# Patient Record
Sex: Female | Born: 1935
Health system: Southern US, Community
[De-identification: ages and names within clinical notes are randomized; demographics above are authoritative.]

## PROBLEM LIST (undated history)

## (undated) DIAGNOSIS — N390 Urinary tract infection, site not specified: Secondary | ICD-10-CM

## (undated) DIAGNOSIS — IMO0001 Reserved for inherently not codable concepts without codable children: Secondary | ICD-10-CM

## (undated) DIAGNOSIS — F0390 Unspecified dementia without behavioral disturbance: Secondary | ICD-10-CM

## (undated) DIAGNOSIS — M1990 Unspecified osteoarthritis, unspecified site: Secondary | ICD-10-CM

## (undated) DIAGNOSIS — E7800 Pure hypercholesterolemia, unspecified: Secondary | ICD-10-CM

## (undated) DIAGNOSIS — K219 Gastro-esophageal reflux disease without esophagitis: Secondary | ICD-10-CM

## (undated) DIAGNOSIS — I10 Essential (primary) hypertension: Secondary | ICD-10-CM

## (undated) DIAGNOSIS — A419 Sepsis, unspecified organism: Secondary | ICD-10-CM

## (undated) HISTORY — DX: Unspecified dementia, unspecified severity, without behavioral disturbance, psychotic disturbance, mood disturbance, and anxiety: F03.90

## (undated) HISTORY — DX: Essential (primary) hypertension: I10

## (undated) HISTORY — DX: Unspecified osteoarthritis, unspecified site: M19.90

## (undated) HISTORY — DX: Gastro-esophageal reflux disease without esophagitis: K21.9

## (undated) HISTORY — DX: Pure hypercholesterolemia, unspecified: E78.00

## (undated) HISTORY — DX: Reserved for inherently not codable concepts without codable children: IMO0001

## (undated) HISTORY — PX: OTHER SURGICAL HISTORY: SHX169

---

## 2001-06-09 ENCOUNTER — Other Ambulatory Visit: Admission: RE | Admit: 2001-06-09 | Discharge: 2001-06-09 | Payer: Self-pay | Admitting: Dermatology

## 2001-11-16 ENCOUNTER — Encounter: Payer: Self-pay | Admitting: Internal Medicine

## 2001-11-16 ENCOUNTER — Ambulatory Visit (HOSPITAL_COMMUNITY): Admission: RE | Admit: 2001-11-16 | Discharge: 2001-11-16 | Payer: Self-pay | Admitting: Internal Medicine

## 2001-11-18 ENCOUNTER — Ambulatory Visit (HOSPITAL_COMMUNITY): Admission: RE | Admit: 2001-11-18 | Discharge: 2001-11-18 | Payer: Self-pay | Admitting: Family Medicine

## 2001-11-18 ENCOUNTER — Encounter: Payer: Self-pay | Admitting: Family Medicine

## 2002-07-03 ENCOUNTER — Encounter: Payer: Self-pay | Admitting: Orthopaedic Surgery

## 2002-07-03 ENCOUNTER — Ambulatory Visit (HOSPITAL_COMMUNITY): Admission: RE | Admit: 2002-07-03 | Discharge: 2002-07-03 | Payer: Self-pay | Admitting: Orthopaedic Surgery

## 2002-11-29 ENCOUNTER — Ambulatory Visit (HOSPITAL_COMMUNITY): Admission: RE | Admit: 2002-11-29 | Discharge: 2002-11-29 | Payer: Self-pay | Admitting: Family Medicine

## 2002-11-29 ENCOUNTER — Encounter: Payer: Self-pay | Admitting: Family Medicine

## 2003-03-27 ENCOUNTER — Ambulatory Visit (HOSPITAL_COMMUNITY): Admission: RE | Admit: 2003-03-27 | Discharge: 2003-03-27 | Payer: Self-pay | Admitting: Family Medicine

## 2003-03-27 ENCOUNTER — Encounter: Payer: Self-pay | Admitting: Family Medicine

## 2003-04-19 ENCOUNTER — Other Ambulatory Visit: Admission: RE | Admit: 2003-04-19 | Discharge: 2003-04-19 | Payer: Self-pay | Admitting: Ophthalmology

## 2003-12-14 ENCOUNTER — Ambulatory Visit (HOSPITAL_COMMUNITY): Admission: RE | Admit: 2003-12-14 | Discharge: 2003-12-14 | Payer: Self-pay | Admitting: Family Medicine

## 2003-12-18 ENCOUNTER — Ambulatory Visit (HOSPITAL_COMMUNITY): Admission: RE | Admit: 2003-12-18 | Discharge: 2003-12-18 | Payer: Self-pay | Admitting: Family Medicine

## 2004-06-09 ENCOUNTER — Ambulatory Visit (HOSPITAL_COMMUNITY): Admission: RE | Admit: 2004-06-09 | Discharge: 2004-06-09 | Payer: Self-pay | Admitting: Orthopaedic Surgery

## 2004-12-16 ENCOUNTER — Ambulatory Visit: Payer: Self-pay | Admitting: Cardiology

## 2005-01-15 ENCOUNTER — Ambulatory Visit (HOSPITAL_COMMUNITY): Admission: RE | Admit: 2005-01-15 | Discharge: 2005-01-15 | Payer: Self-pay | Admitting: Family Medicine

## 2005-01-16 ENCOUNTER — Ambulatory Visit: Payer: Self-pay | Admitting: Cardiology

## 2005-02-13 ENCOUNTER — Ambulatory Visit (HOSPITAL_COMMUNITY): Admission: RE | Admit: 2005-02-13 | Discharge: 2005-02-13 | Payer: Self-pay | Admitting: Cardiology

## 2005-02-13 ENCOUNTER — Ambulatory Visit: Payer: Self-pay | Admitting: *Deleted

## 2005-03-06 ENCOUNTER — Ambulatory Visit: Payer: Self-pay | Admitting: *Deleted

## 2005-03-19 ENCOUNTER — Ambulatory Visit: Payer: Self-pay | Admitting: *Deleted

## 2005-06-01 ENCOUNTER — Ambulatory Visit (HOSPITAL_COMMUNITY): Admission: RE | Admit: 2005-06-01 | Discharge: 2005-06-01 | Payer: Self-pay | Admitting: Family Medicine

## 2005-06-04 ENCOUNTER — Ambulatory Visit: Payer: Self-pay | Admitting: Internal Medicine

## 2006-02-02 ENCOUNTER — Ambulatory Visit (HOSPITAL_COMMUNITY): Admission: RE | Admit: 2006-02-02 | Discharge: 2006-02-02 | Payer: Self-pay | Admitting: Family Medicine

## 2006-04-14 ENCOUNTER — Ambulatory Visit: Payer: Self-pay | Admitting: Internal Medicine

## 2006-04-14 ENCOUNTER — Ambulatory Visit (HOSPITAL_COMMUNITY): Admission: RE | Admit: 2006-04-14 | Discharge: 2006-04-14 | Payer: Self-pay | Admitting: Internal Medicine

## 2006-07-28 ENCOUNTER — Ambulatory Visit: Payer: Self-pay | Admitting: Internal Medicine

## 2007-02-14 ENCOUNTER — Ambulatory Visit (HOSPITAL_COMMUNITY): Admission: RE | Admit: 2007-02-14 | Discharge: 2007-02-14 | Payer: Self-pay | Admitting: Ophthalmology

## 2007-05-16 ENCOUNTER — Ambulatory Visit (HOSPITAL_COMMUNITY): Admission: RE | Admit: 2007-05-16 | Discharge: 2007-05-16 | Payer: Self-pay | Admitting: Ophthalmology

## 2007-10-25 ENCOUNTER — Ambulatory Visit (HOSPITAL_COMMUNITY): Admission: RE | Admit: 2007-10-25 | Discharge: 2007-10-25 | Payer: Self-pay | Admitting: Family Medicine

## 2008-01-31 ENCOUNTER — Ambulatory Visit (HOSPITAL_COMMUNITY): Admission: RE | Admit: 2008-01-31 | Discharge: 2008-01-31 | Payer: Self-pay | Admitting: Family Medicine

## 2008-02-23 ENCOUNTER — Ambulatory Visit: Payer: Self-pay | Admitting: Internal Medicine

## 2008-02-23 ENCOUNTER — Encounter: Payer: Self-pay | Admitting: Cardiovascular Disease

## 2008-03-06 ENCOUNTER — Ambulatory Visit (HOSPITAL_COMMUNITY): Admission: RE | Admit: 2008-03-06 | Discharge: 2008-03-06 | Payer: Self-pay | Admitting: Family Medicine

## 2008-11-06 ENCOUNTER — Ambulatory Visit (HOSPITAL_COMMUNITY): Admission: RE | Admit: 2008-11-06 | Discharge: 2008-11-06 | Payer: Self-pay | Admitting: Family Medicine

## 2009-01-02 ENCOUNTER — Ambulatory Visit (HOSPITAL_COMMUNITY): Admission: RE | Admit: 2009-01-02 | Discharge: 2009-01-02 | Payer: Self-pay | Admitting: Family Medicine

## 2009-06-03 ENCOUNTER — Encounter: Payer: Self-pay | Admitting: Gastroenterology

## 2009-07-29 ENCOUNTER — Ambulatory Visit (HOSPITAL_COMMUNITY): Admission: RE | Admit: 2009-07-29 | Discharge: 2009-07-29 | Payer: Self-pay | Admitting: Ophthalmology

## 2009-09-10 ENCOUNTER — Ambulatory Visit (HOSPITAL_COMMUNITY): Admission: RE | Admit: 2009-09-10 | Discharge: 2009-09-10 | Payer: Self-pay | Admitting: Family Medicine

## 2009-11-19 ENCOUNTER — Observation Stay (HOSPITAL_COMMUNITY): Admission: EM | Admit: 2009-11-19 | Discharge: 2009-11-21 | Payer: Self-pay | Admitting: Emergency Medicine

## 2010-01-29 ENCOUNTER — Ambulatory Visit (HOSPITAL_COMMUNITY): Admission: RE | Admit: 2010-01-29 | Discharge: 2010-01-29 | Payer: Self-pay | Admitting: Internal Medicine

## 2010-02-11 ENCOUNTER — Ambulatory Visit (HOSPITAL_COMMUNITY): Admission: RE | Admit: 2010-02-11 | Discharge: 2010-02-11 | Payer: Self-pay | Admitting: Internal Medicine

## 2010-03-10 ENCOUNTER — Emergency Department (HOSPITAL_COMMUNITY): Admission: EM | Admit: 2010-03-10 | Discharge: 2010-03-11 | Payer: Self-pay | Admitting: Emergency Medicine

## 2010-03-10 ENCOUNTER — Encounter: Payer: Self-pay | Admitting: Orthopedic Surgery

## 2010-03-11 ENCOUNTER — Ambulatory Visit: Payer: Self-pay | Admitting: Orthopedic Surgery

## 2010-03-11 DIAGNOSIS — S52599A Other fractures of lower end of unspecified radius, initial encounter for closed fracture: Secondary | ICD-10-CM | POA: Insufficient documentation

## 2010-03-12 ENCOUNTER — Telehealth: Payer: Self-pay | Admitting: Orthopedic Surgery

## 2010-03-12 ENCOUNTER — Encounter: Payer: Self-pay | Admitting: Orthopedic Surgery

## 2010-03-13 ENCOUNTER — Encounter (INDEPENDENT_AMBULATORY_CARE_PROVIDER_SITE_OTHER): Payer: Self-pay | Admitting: *Deleted

## 2010-03-13 ENCOUNTER — Ambulatory Visit: Payer: Self-pay | Admitting: Orthopedic Surgery

## 2010-03-13 DIAGNOSIS — S52609A Unspecified fracture of lower end of unspecified ulna, initial encounter for closed fracture: Secondary | ICD-10-CM

## 2010-03-13 DIAGNOSIS — S52509A Unspecified fracture of the lower end of unspecified radius, initial encounter for closed fracture: Secondary | ICD-10-CM | POA: Insufficient documentation

## 2010-03-14 ENCOUNTER — Ambulatory Visit (HOSPITAL_COMMUNITY): Admission: RE | Admit: 2010-03-14 | Discharge: 2010-03-14 | Payer: Self-pay | Admitting: Orthopedic Surgery

## 2010-03-14 ENCOUNTER — Ambulatory Visit: Payer: Self-pay | Admitting: Orthopedic Surgery

## 2010-03-17 ENCOUNTER — Encounter: Payer: Self-pay | Admitting: Orthopedic Surgery

## 2010-03-18 ENCOUNTER — Ambulatory Visit: Payer: Self-pay | Admitting: Orthopedic Surgery

## 2010-03-20 ENCOUNTER — Encounter (HOSPITAL_COMMUNITY): Admission: RE | Admit: 2010-03-20 | Discharge: 2010-04-25 | Payer: Self-pay | Admitting: Orthopedic Surgery

## 2010-03-20 ENCOUNTER — Encounter: Payer: Self-pay | Admitting: Orthopedic Surgery

## 2010-04-08 ENCOUNTER — Ambulatory Visit: Payer: Self-pay | Admitting: Orthopedic Surgery

## 2010-04-10 ENCOUNTER — Telehealth: Payer: Self-pay | Admitting: Orthopedic Surgery

## 2010-04-11 ENCOUNTER — Encounter: Payer: Self-pay | Admitting: Orthopedic Surgery

## 2010-04-16 ENCOUNTER — Encounter: Payer: Self-pay | Admitting: Orthopedic Surgery

## 2010-04-17 ENCOUNTER — Ambulatory Visit: Payer: Self-pay | Admitting: Orthopedic Surgery

## 2010-04-21 ENCOUNTER — Telehealth: Payer: Self-pay | Admitting: Orthopedic Surgery

## 2010-04-24 ENCOUNTER — Ambulatory Visit: Payer: Self-pay | Admitting: Orthopedic Surgery

## 2010-04-28 ENCOUNTER — Encounter (HOSPITAL_COMMUNITY): Admission: RE | Admit: 2010-04-28 | Discharge: 2010-05-28 | Payer: Self-pay | Admitting: Orthopedic Surgery

## 2010-05-13 ENCOUNTER — Ambulatory Visit: Payer: Self-pay | Admitting: Orthopedic Surgery

## 2010-05-15 ENCOUNTER — Encounter: Payer: Self-pay | Admitting: Orthopedic Surgery

## 2010-05-30 ENCOUNTER — Encounter (HOSPITAL_COMMUNITY): Admission: RE | Admit: 2010-05-30 | Discharge: 2010-06-29 | Payer: Self-pay | Admitting: Orthopedic Surgery

## 2010-06-11 ENCOUNTER — Ambulatory Visit: Payer: Self-pay | Admitting: Orthopedic Surgery

## 2010-07-01 ENCOUNTER — Encounter (HOSPITAL_COMMUNITY): Admission: RE | Admit: 2010-07-01 | Discharge: 2010-07-31 | Payer: Self-pay | Admitting: Orthopedic Surgery

## 2010-07-01 ENCOUNTER — Encounter: Payer: Self-pay | Admitting: Orthopedic Surgery

## 2010-07-08 ENCOUNTER — Encounter: Payer: Self-pay | Admitting: Orthopedic Surgery

## 2010-07-14 ENCOUNTER — Ambulatory Visit: Payer: Self-pay | Admitting: Orthopedic Surgery

## 2010-07-14 DIAGNOSIS — M25519 Pain in unspecified shoulder: Secondary | ICD-10-CM | POA: Insufficient documentation

## 2010-07-15 ENCOUNTER — Encounter: Payer: Self-pay | Admitting: Orthopedic Surgery

## 2010-08-14 ENCOUNTER — Ambulatory Visit: Payer: Self-pay | Admitting: Orthopedic Surgery

## 2010-11-13 ENCOUNTER — Ambulatory Visit: Payer: Self-pay | Admitting: Orthopedic Surgery

## 2010-11-13 DIAGNOSIS — M7710 Lateral epicondylitis, unspecified elbow: Secondary | ICD-10-CM | POA: Insufficient documentation

## 2010-12-17 ENCOUNTER — Ambulatory Visit: Payer: Self-pay | Admitting: Orthopedic Surgery

## 2010-12-17 DIAGNOSIS — M67919 Unspecified disorder of synovium and tendon, unspecified shoulder: Secondary | ICD-10-CM | POA: Insufficient documentation

## 2010-12-17 DIAGNOSIS — M719 Bursopathy, unspecified: Secondary | ICD-10-CM

## 2011-01-04 IMAGING — CR DG ABDOMEN 2V
2 series · 2 of 2 positions shown · non-contrast
Comparison: None

CLINICAL DATA: Dizziness, weakness.  Intermittent abdominal pain.

ABDOMEN - 2 VIEW

[view not recorded (1 of 2)]
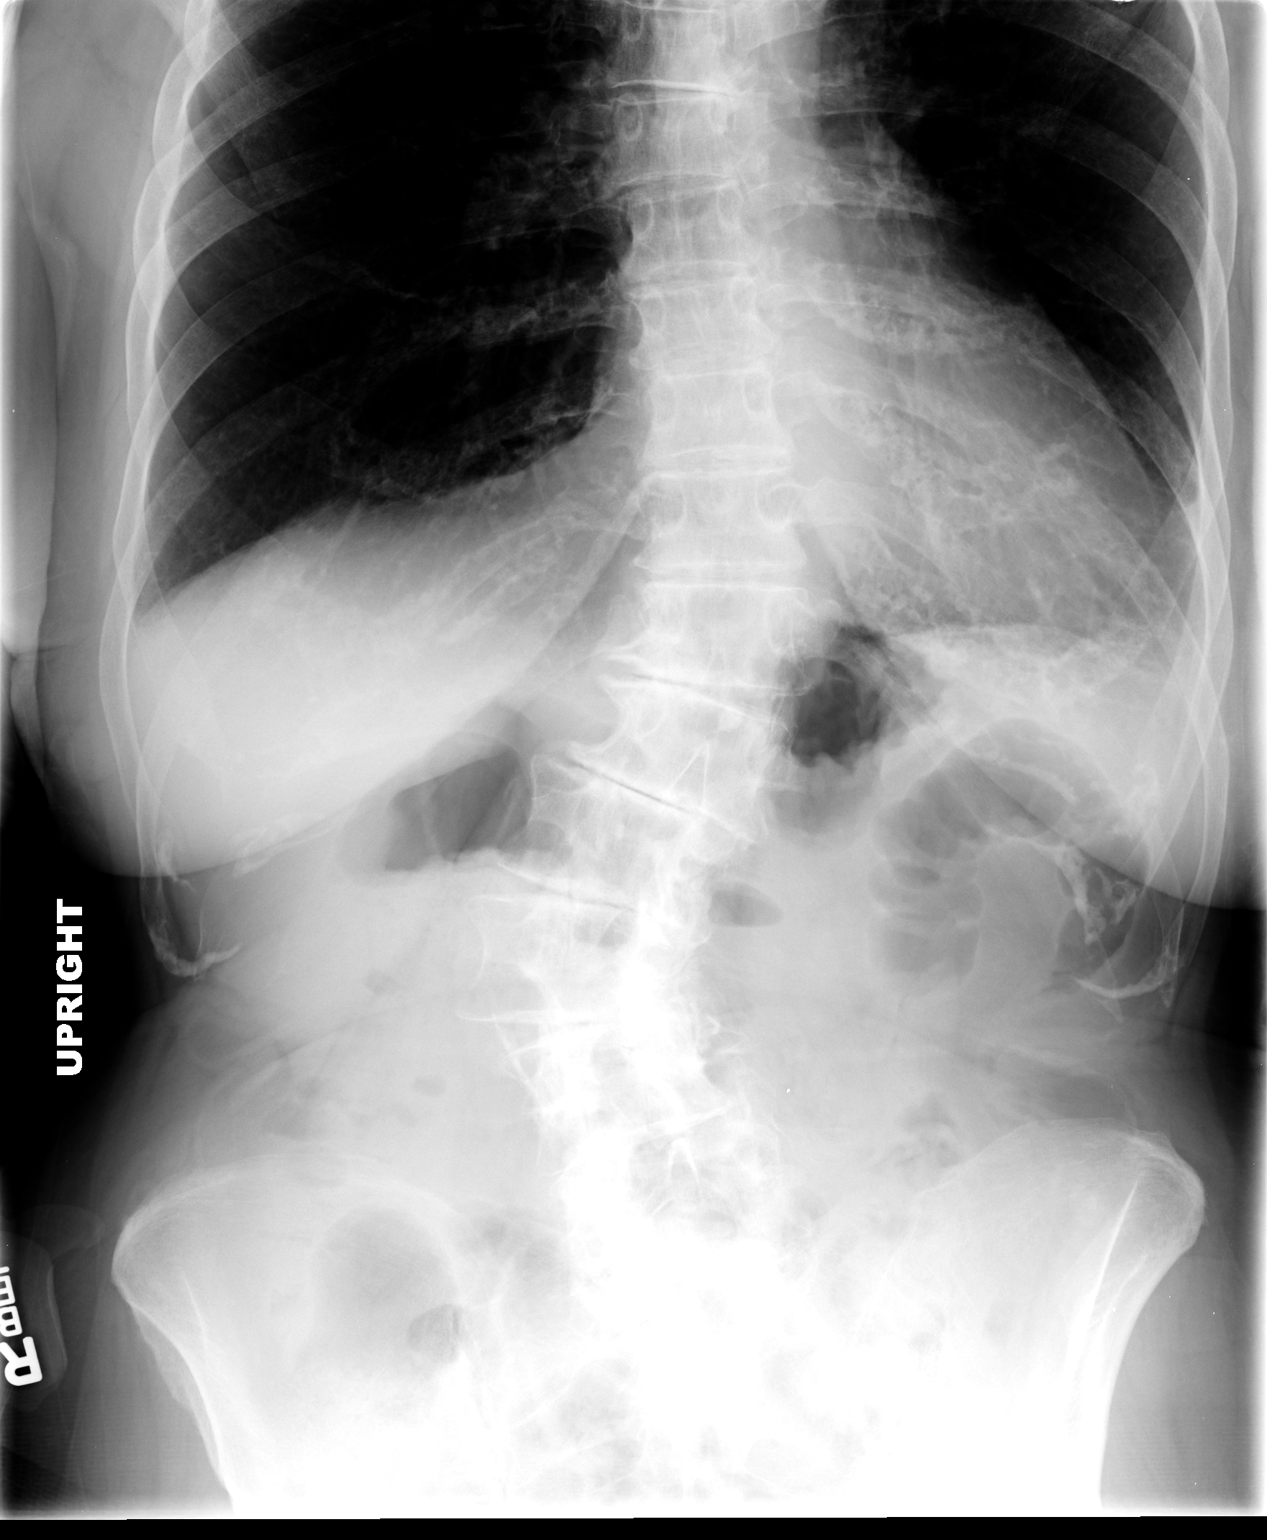

[view not recorded (2 of 2)]
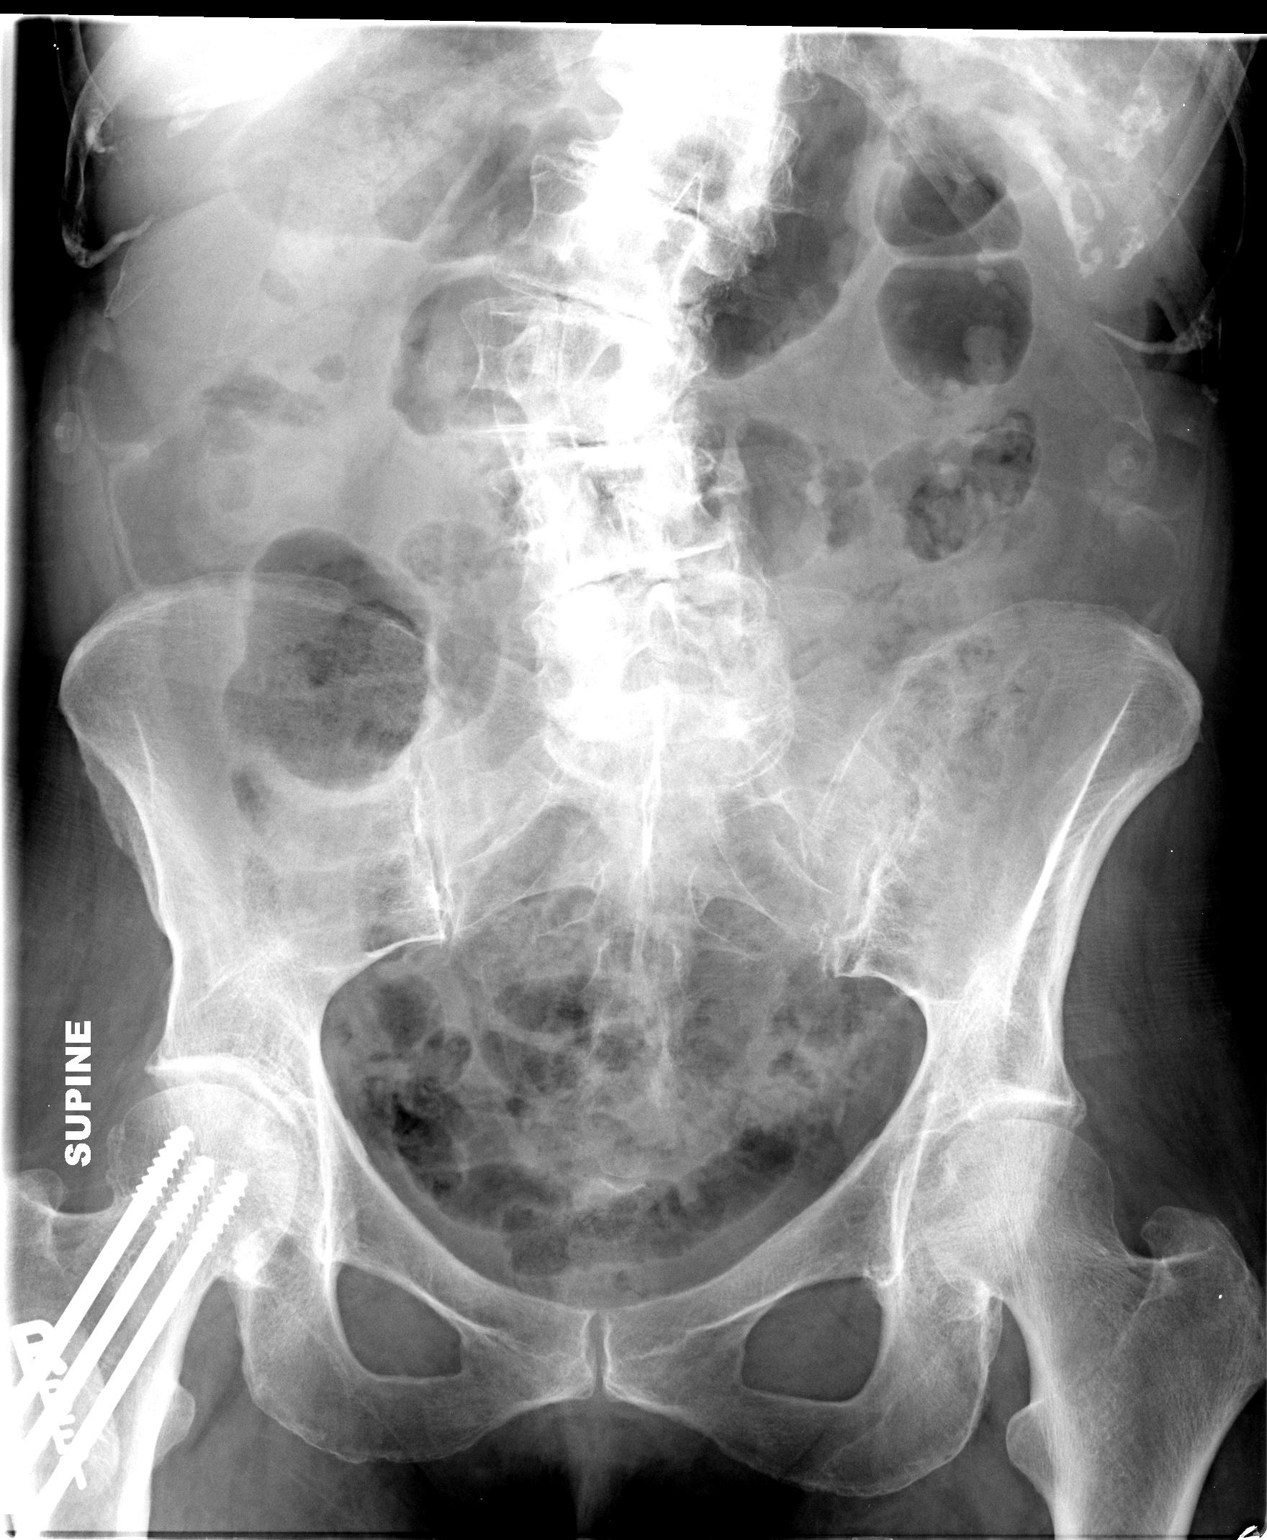

[2 of 2 positions shown; findings below may reference images not displayed]

FINDINGS: There is a nonobstructive bowel gas pattern.  No
organomegaly or suspicious calcification.  No free air.  Severe
scoliosis in the lumbar spine with associated severe degenerative
changes.  Right femoral neck screws in place.

No suspicious calcification.
IMPRESSION: No acute findings in the abdomen.

## 2011-01-04 IMAGING — CT CT HEAD W/O CM
1 series · 16 of 30 positions shown, 20 images · non-contrast
Comparison: 01/02/2009

CLINICAL DATA: Dizziness, weakness.  Headaches.

CT HEAD WITHOUT CONTRAST
TECHNIQUE: Contiguous axial images were obtained from the base of
the skull through the vertex without contrast.

[Series 2: headseq 4.8 h37s · axial · 0.43mm/px · z∈[+111,+268]mm · 16 of 36 slices shown, 20 images]
[im 2/36  brain]
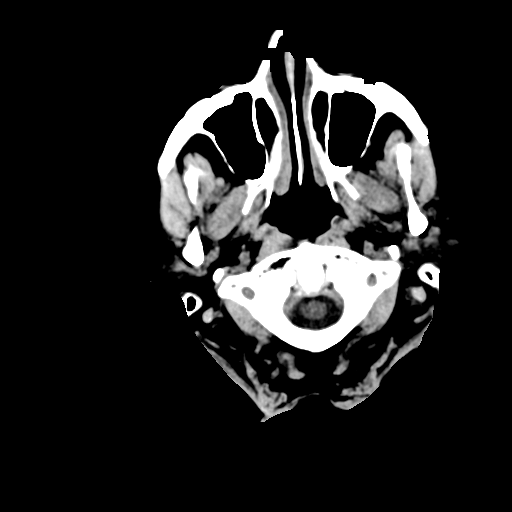
[im 2/36  bone]
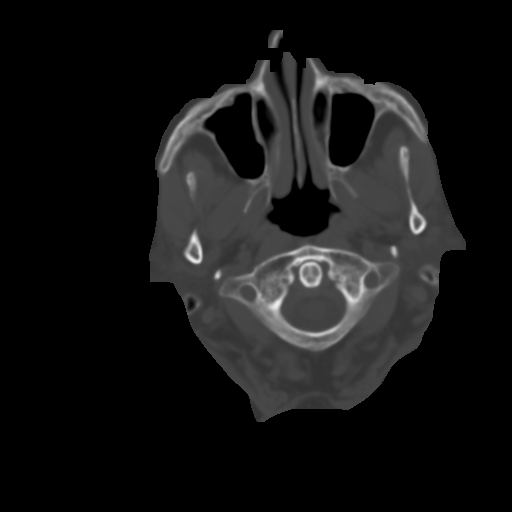
[im 4/36  brain]
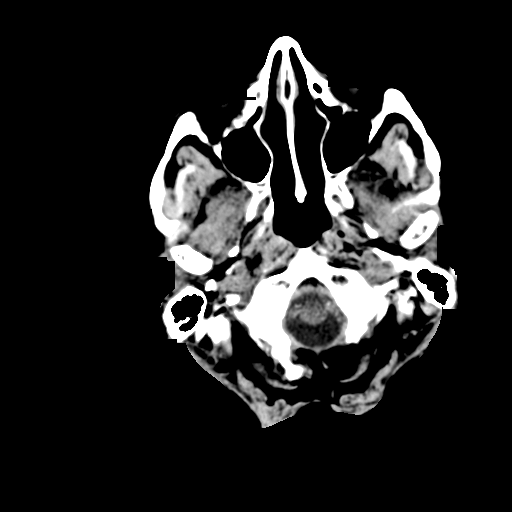
[im 7/36  brain]
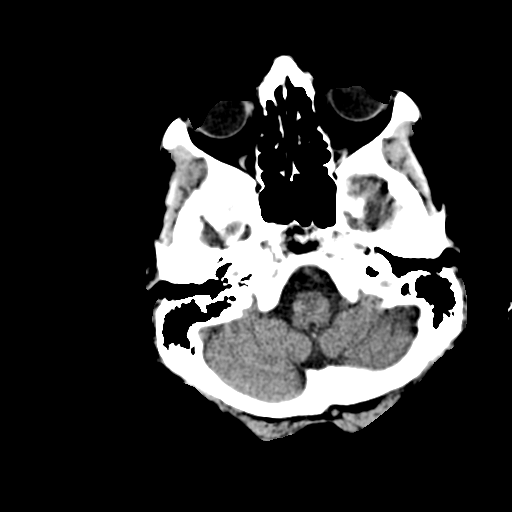
[im 9/36  brain]
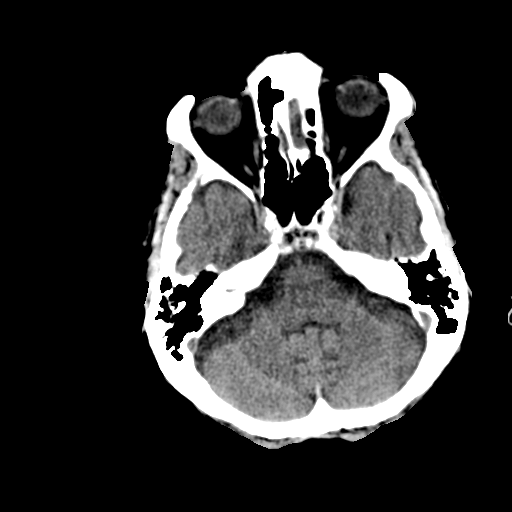
[im 10/36  brain]
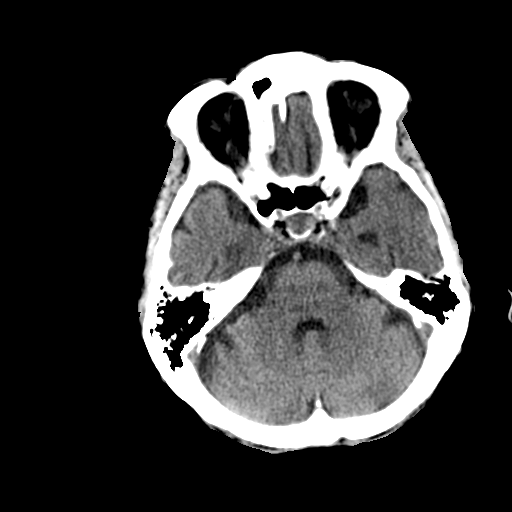
[im 10/36  bone]
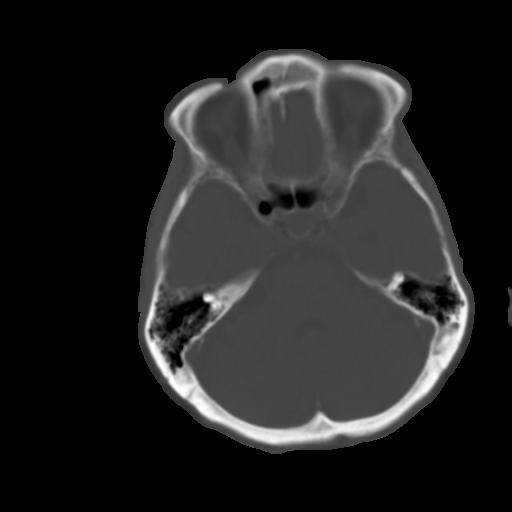
[im 13/36  brain]
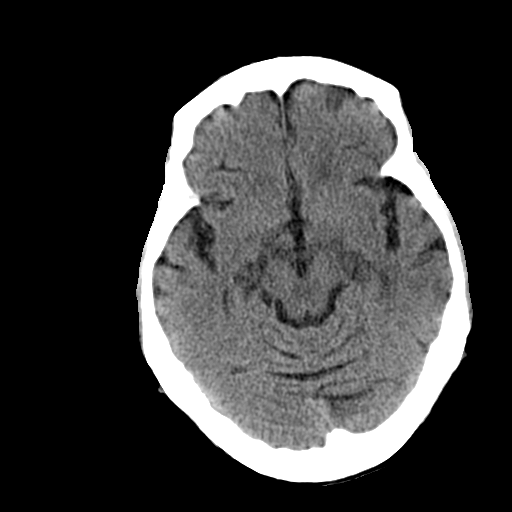
[im 15/36  brain]
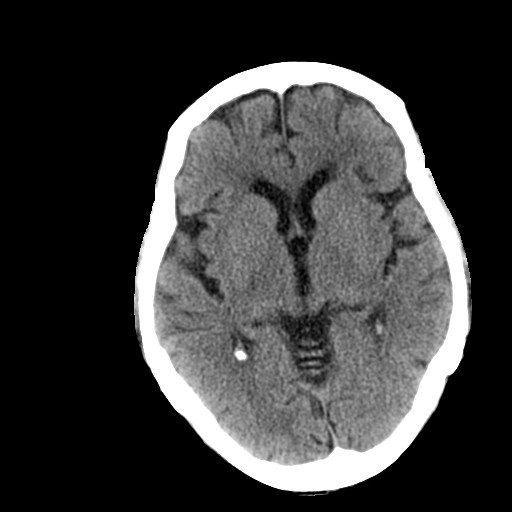
[im 17/36  brain]
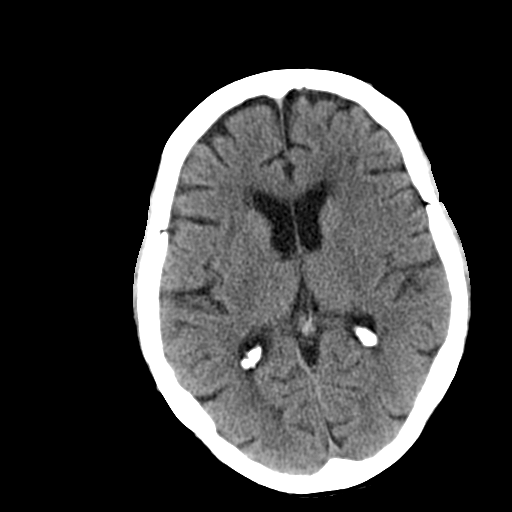
[im 19/36  brain]
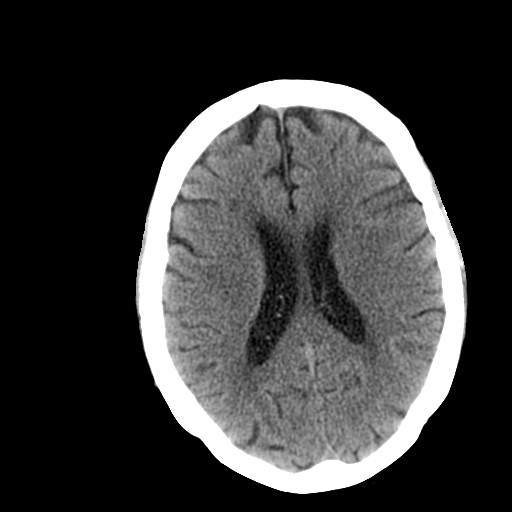
[im 19/36  bone]
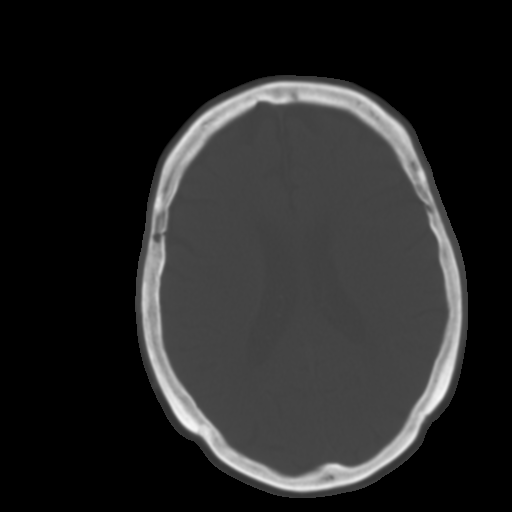
[im 21/36  brain]
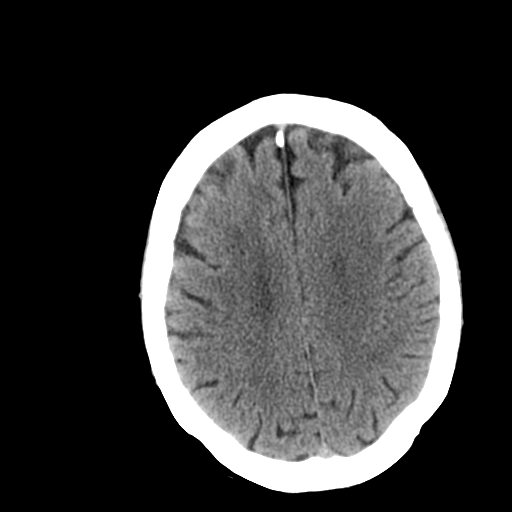
[im 23/36  brain]
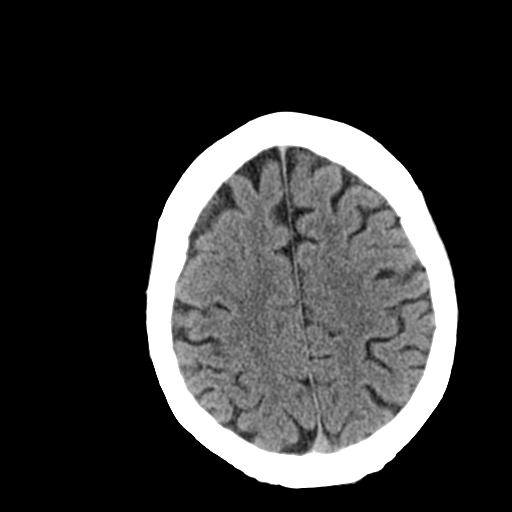
[im 26/36  brain]
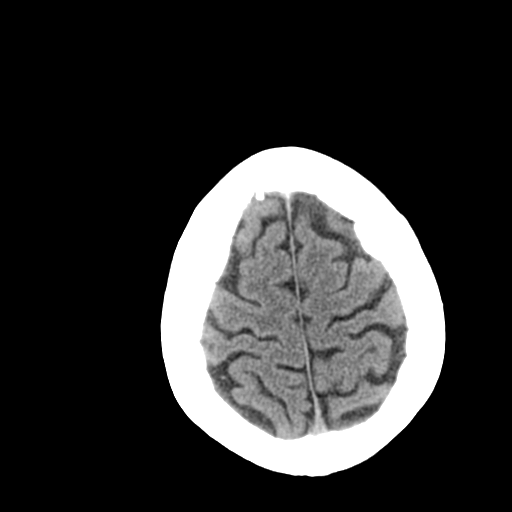
[im 27/36  brain]
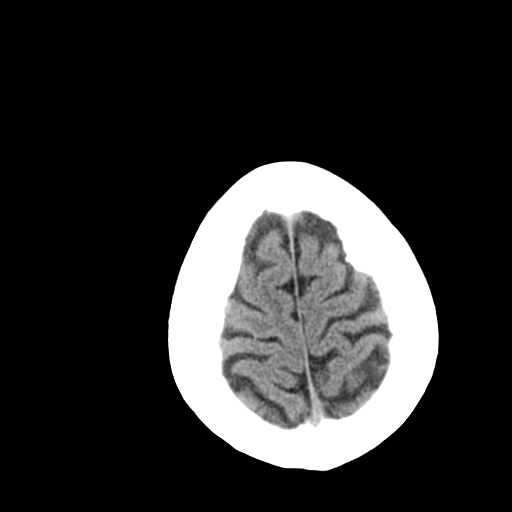
[im 27/36  bone]
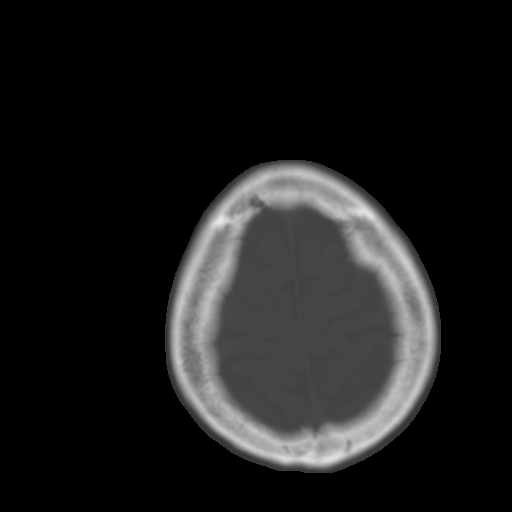
[im 29/36  brain]
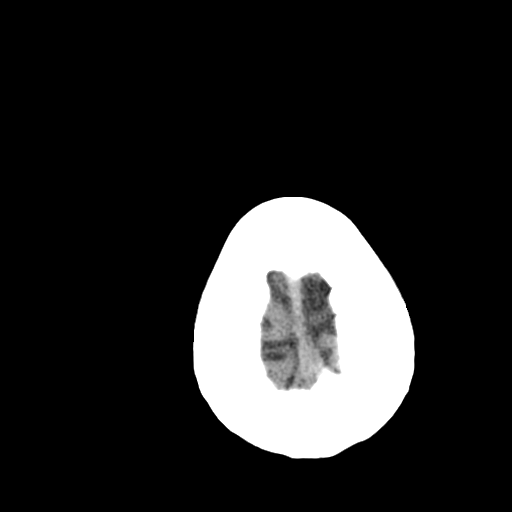
[im 32/36  brain]
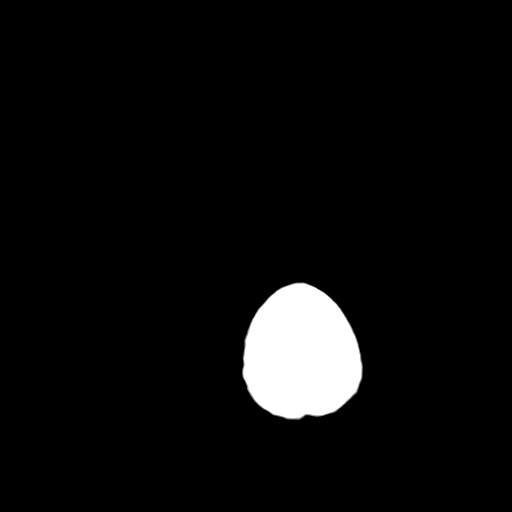
[im 34/36  brain]
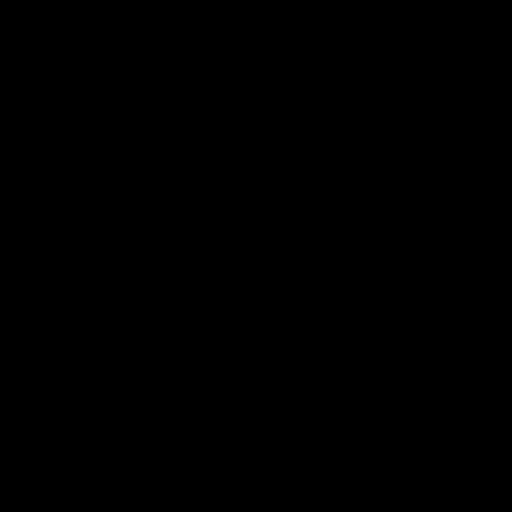

[16 of 30 positions shown; findings below may reference images not displayed]

FINDINGS: Patchy chronic small vessel disease changes in the deep
white matter. No acute intracranial abnormality.  Specifically, no
hemorrhage, hydrocephalus, mass lesion, acute infarction, or
significant intracranial injury.  No acute calvarial abnormality.

Visualized paranasal sinuses and mastoids clear.  Orbital soft
tissues unremarkable.
IMPRESSION: No acute intracranial abnormality.

Patchy chronic small vessel disease.

## 2011-01-18 ENCOUNTER — Encounter (INDEPENDENT_AMBULATORY_CARE_PROVIDER_SITE_OTHER): Payer: Self-pay | Admitting: Internal Medicine

## 2011-01-27 NOTE — Assessment & Plan Note (Signed)
Summary: 1 WK RE-CK LT WRIST/HUMANA/CAF   Visit Type:  Follow-up Referring Provider:  ap er Primary Provider:  Dr. Dwana Melena  CC:  left wrist post op.  History of Present Illness:    This is postop day 34 for this 77 year old female who had open treatment internal fixation LEFT wrist with DVR plate.  Somehow she did not come in for her March 31 appointment for staple removal and x-ray  She is taking Tylenol for pain complains of some volar wrist pain  imaging:  The x-rays were obtained including 3 views of the wrist fracture is healing in good position and the plate is in good position  one-week recheck wound after delayed staple removal  The patient did see Dr. Hilda Lias, who started her on antibiotics because her wound drainage and purulence. The wound seems to be much better today. He also started some wound care and physical therapy for her hand, which was started.  The wound looks better. Today. We will continue her antibiotics and hand therapy, as well as one cannot check the wound again in a week  Allergies: No Known Drug Allergies   Impression & Recommendations:  Problem # 1:  CLOSED FRACTURE OF LOWER END OF RADIUS WITH ULNA (ICD-813.44)  Orders: Post-Op Check (09811)  Patient Instructions: 1)  1 week check wound

## 2011-01-27 NOTE — Letter (Signed)
Summary: Dr Hilda Lias Office notes  Dr Hilda Lias Office notes   Imported By: Cammie Sickle 04/23/2010 12:38:35  _____________________________________________________________________  External Attachment:    Type:   Image     Comment:   External Document

## 2011-01-27 NOTE — Miscellaneous (Signed)
Summary: Fuctional Progress chart  Fuctional Progress chart   Imported By: Jacklynn Ganong 07/04/2010 10:24:17  _____________________________________________________________________  External Attachment:    Type:   Image     Comment:   External Document

## 2011-01-27 NOTE — Miscellaneous (Signed)
Summary: OT Discharge summary  OT Discharge summary   Imported By: Jacklynn Ganong 07/17/2010 09:29:28  _____________________________________________________________________  External Attachment:    Type:   Image     Comment:   External Document

## 2011-01-27 NOTE — Progress Notes (Signed)
Summary: does she need to come in tomorrow?  Phone Note Call from Patient   Summary of Call: Dawn Lee (10/08/1923) saw Dr. Dwana Melena  today and he cut back on her BP meds and wants to see her again on Monday (03/17/10) She has an appointment scheduled to see you tomorrow and wants to know if you need to see her before she sees Dr. Margo Aye for the rechek on her BP. We need to call Para March at [phone] Initial call taken by: Jacklynn Ganong,  March 12, 2010 12:16 PM  Follow-up for Phone Call        yes that's good angular N. because I want to go ahead and put the cast on in case she can't have the surgery Follow-up by: Fuller Canada MD,  March 12, 2010 12:24 PM  Additional Follow-up for Phone Call Additional follow up Details #1::        Left a message for Para March to call the office Additional Follow-up by: Jacklynn Ganong,  March 12, 2010 12:32 PM

## 2011-01-27 NOTE — Assessment & Plan Note (Signed)
Summary: eval for fx wrist/bsf   Vital Signs:  Patient profile:   77 year old female Pulse rate:   66 / minute Resp:     16 per minute  Vitals Entered By: Fuller Canada MD (March 11, 2010 4:07 PM)  Visit Type:  initial visit Referring Provider:  ap er Primary Provider:  Dr. Dwana Melena  CC:  left wrist fracture.  History of Present Illness: 77 year old female walk-in patient after seen in the ER for fractured LEFT wrist  Patient fell at home injuring her LEFT wrist, complains of throbbing burning constant pain which is a 9/10.  She apparently got dizzy and has been getting dizzy over the last month or so.  Her primary care physician is Dr. Dannette Barbara.  DOI 03/10/10 fell.  [facility] xrays left wrist 03/10/10.  Meds: Benicar, Pantoprazole, Lovastatin, Tylenol with Codeine, Tylenol, Advil, Neurontin.    Allergies (verified): No Known Drug Allergies  Past History:  Past Medical History: htn reflux cholesterol  Past Surgical History: pins rt hip  Family History: Family History of Diabetes Family History Coronary Heart Disease female < 80 Family History of Arthritis  Social History: Patient is widowed.  retired no smoking  no alcohol no caffeine  Review of Systems Constitutional:  Complains of weight loss; denies weight gain, fever, chills, and fatigue. Cardiovascular:  Complains of palpitations, fainting, and murmurs; denies chest pain. Respiratory:  Complains of snoring; denies short of breath, wheezing, couch, tightness, pain on inspiration, and snoring . Gastrointestinal:  Complains of heartburn and blood in your stools; denies nausea, vomiting, diarrhea, and constipation. Genitourinary:  Denies frequency, urgency, difficulty urinating, painful urination, flank pain, and bleeding in urine. Neurologic:  Complains of unsteady gait and dizziness; denies numbness, tingling, tremors, and seizure. Musculoskeletal:  Complains of joint pain, instability, stiffness, and muscle  pain; denies swelling, redness, and heat. Endocrine:  Denies excessive thirst, exessive urination, and heat or cold intolerance. Psychiatric:  Denies nervousness, depression, anxiety, and hallucinations. Skin:  Denies changes in the skin, poor healing, rash, itching, and redness. HEENT:  Denies blurred or double vision, eye pain, redness, and watering; headache. Immunology:  Denies seasonal allergies, sinus problems, and allergic to bee stings. Hemoatologic:  Complains of brusing; denies easy bleeding.  Physical Exam  Additional Exam:  Vital signs are stable.  Appearance is normal.  Cardiovascular exam is normal.  Lymph nodes negative in the upper extremity  Skin normal  Neurologic exam normal  Psychiatric exam normal  Musculoskeletal exam showsith assistance  Her LEFT upper extremity is in a splint I can see all of her digits are mildly swollen she has full range of motion in the hand, no motor deficit, noticed instability.     Impression & Recommendations:  Problem # 1:  OTHER CLOSED FRACTURES OF DISTAL END OF RADIUS (BJY-782.95) Assessment New  the x-rays from the hospital show a comminuted intra-articular fracture of the LEFT distal radius with shortening loss of radial inclination and dorsal tilt.  Recommend open treatment internal fixation pending medical clearance  Patient referred back to her medical doctor for medical clearance  Orders: New Patient Level III (62130)  Patient Instructions: 1)  followup in 2 days please see medical physician

## 2011-01-27 NOTE — Assessment & Plan Note (Signed)
Summary: 3 mo reck left wrist/humana/bsf   Visit Type:  Follow-up Referring Provider:  ap er Primary Provider:  Dr. Dwana Melena  CC:  left wrist.  History of Present Illness: I saw Dawn Lee in the office today for a 3 month followup visit.  She is a 77 years old woman with the complaint of:  left wrist.  Distal radius and ulnar fracture treated with DVR volar plate with open treatment internal fixation on March 18.  Medications: Tylenol   The patient complains of LEFT shoulder and elbow pain, but no pain with forward elevation. She says she can't drive.  Her wrist pain has resolved and her wrist motion has returned to normal.   Allergies: No Known Drug Allergies  Review of Systems Neurologic:  Denies numbness and tingling. Musculoskeletal:  Complains of joint pain.  Physical Exam  Skin:  intact without lesions or rashes Psych:  alert and cooperative; normal mood and affect; normal attention span and concentration   Shoulder/Elbow Exam  General:    Well-developed, well-nourished, normal body habitus; no deformities, normal grooming.    Skin:    Intact, no scars, lesions, rashes, cafe au lait spots or bruising.    Palpation:    Non-tender to palpation LEFT shoulder  Vascular:    Radial, ulnar, brachial, and axillary pulses 2+ and symmetric; capillary refill less than 2 seconds; no evidence of ischemia, clubbing, or cyanosis.    Sensory:    Gross sensation intact in the upper extremities.    Motor:    Normal strength in the LEFT rotator cuff  Elbow Exam:    Left:    Inspection:  Abnormal    Palpation:  Abnormal    Stability:  stable    Tenderness:  left lateral epicondyle    Swelling:  left lateral epicondyle    normal flexion and extension of the elbow and normal pronation of the forearm. Normal supination of the forearm   Impression & Recommendations:  Problem # 1:  LATERAL EPICONDYLITIS (ICD-726.32) Assessment New  inject LEFT elbow lateral  epicondyle.  The  elbow was prepped with alcohol and anesthetized with ethyl chloride. 40 mg of Depo-Medrol and 5 cc of 1% lidocaine were injected along the lateral epicondyle. No complications. Verbal consent was obtained prior to injection.  Orders: Est. Patient Level III (57846) Joint Aspirate / Injection, Intermediate (20605) Depo- Medrol 40mg  (J1030)  Patient Instructions: 1)  You have received an injection of cortisone today. You may experience increased pain at the injection site. Apply ice pack to the area for 20 minutes every 2 hours and take 2 xtra strength tylenol every 8 hours. This increased pain will usually resolve in 24 hours. The injection will take effect in 3-10 days.   2)  if not better in 2 weeks call for 2nd injection    Orders Added: 1)  Est. Patient Level III [96295] 2)  Joint Aspirate / Injection, Intermediate [20605] 3)  Depo- Medrol 40mg  [J1030]

## 2011-01-27 NOTE — Letter (Signed)
Summary: Generic Letter [facility]  [address] Edmund Hilda [address]  [place], [place] [address]   Phone: [phone]  Fax: [phone]    05/13/2010  JERRIS KELTZ [address] [place], [place]  [address]  Dear Dear Dereck Leep  I am requesting a fast appeal for the above patient for further therapy on the wist as she has not regained functional range of motion and the health of her wrist is at risk.           Sincerely,   Fuller Canada MD

## 2011-01-27 NOTE — Miscellaneous (Signed)
Summary: Functional progress chart  Functional progress chart   Imported By: Jacklynn Ganong 05/21/2010 16:37:29  _____________________________________________________________________  External Attachment:    Type:   Image     Comment:   External Document

## 2011-01-27 NOTE — Miscellaneous (Signed)
Summary: Pre-authorization not required out-patient procedure  Clinical Lists Changes   12:35pm, contacted insurer [facility] [employer] P[employer] [phone], re: out-patient surgery scheduled tomorrow, 03/14/10 [facility] CPT [phone], dx [phone]. Per Ayla, no pre-authorization is required. Ref# [id number].

## 2011-01-27 NOTE — Assessment & Plan Note (Signed)
Summary: RE-CK WRIST/HUMANA/CAF   Visit Type:  Follow-up  CC:  recheck wrist.  History of Present Illness:  77 year old female walk-in patient after seen in the ER for fractured LEFT wrist  Patient fell at home injuring her LEFT wrist, complains of throbbing burning constant pain which is a 9/10.  She apparently got dizzy and has been getting dizzy over the last month or so.  Her primary care physician is Dr. Dannette Barbara.  DOI 03/10/10 fell.  [facility] xrays left wrist 03/10/10.  Meds: Benicar, Pantoprazole, Lovastatin, Tylenol with Codeine, Tylenol, Advil, Neurontin.Meds: Benicar, Pantoprazole, Lovastatin, Tylenol with Codeine, Tylenol, Advil, Neurontin.  She was seen 03/11/10 needs surgery, was seen by Dr. Margo Aye 03/12/10 he is clear for surgery, he decreased her blood pressure medication and stopped her Neurontin she feels much better today no dizziness. Today BP 118/72.  Current Medications (verified): 1)  Metoprolol Succinate 25 Mg Xr24h-Tab (Metoprolol Succinate) .... Take One Tablet By Mouth Daily 2)  Lansoprazole 30 Mg Cpdr (Lansoprazole) 3)  Lovastatin 40 Mg Tabs (Lovastatin) .... Take  Two  Tablet By Mouth Daily At Bedtime  Allergies (verified): No Known Drug Allergies  Past History:  Allergies (verified):  No Known Drug Allergies Past History: Past Medical History: htn reflux cholesterol Past Surgical History: pins rt hip Family History: Family History of Diabetes Family History Coronary Heart Disease female < 55 Family History of Arthritis Social History: Patient is widowed.  retired no smoking  no alcohol no caffeine Review of Systems  Constitutional:  Complains of weight loss; denies weight gain, fever, chills, and fatigue. Cardiovascular:  Complains of palpitations, fainting, and murmurs; denies chest pain. Respiratory:  Complains of snoring; denies short of breath, wheezing, couch, tightness, pain on inspiration, and snoring . Gastrointestinal:  Complains of  heartburn and blood in your stools; denies nausea, vomiting, diarrhea, and constipation. Genitourinary:  Denies frequency, urgency, difficulty urinating, painful urination, flank pain, and bleeding in urine. Neurologic:  Complains of unsteady gait and dizziness; denies numbness, tingling, tremors, and seizure. Musculoskeletal:  Complains of joint pain, instability, stiffness, and muscle pain; denies swelling, redness, and heat. Endocrine:  Denies excessive thirst, exessive urination, and heat or cold intolerance. Psychiatric:  Denies nervousness, depression, anxiety, and hallucinations. Skin:  Denies changes in the skin, poor healing, rash, itching, and redness. HEENT:  Denies blurred or double vision, eye pain, redness, and watering; headache. Immunology:  Denies seasonal allergies, sinus problems, and allergic to bee stings. Hemoatologic:  Complains of brusing; denies easy bleeding.  Physical Exam  Additional Exam:  GEN: well developed, well nourished, normal grooming and hygiene, no deformity and normal body habitus.   CDV: pulses are normal, no edema, no erythema. no tenderness  Lymph: normal lymph nodes   Skin: no rashes, skin lesions or open sores   NEURO: normal coordination, reflexes, sensation.   Psyche: awake, alert and oriented. Mood normal   Gait: slightly abnormal but she ambulates on her own without assistive device  LEFT wrist examination and LEFT upper extremity examination  Shoulder elbow nontender humerus nontender forearm nontender, tenderness distal radius with swelling mild deformity.  Painful range of motion in the wrist limited to 30.  Motor exam normal flexion-extension of the fingers normal  No instability  Lower extremities  The upper extremities have normal appearance, ROM, strength and stability.  RIGHT upper extremity  No contracture subluxation atrophy tremor or tenderness  Inspection ROM Motor Stability    Impression &  Recommendations:  Problem # 1:  CLOSED FRACTURE OF  LOWER END OF RADIUS WITH ULNA (ICD-813.44) Assessment Comment Only comminuted intra-articular fracture distal radius with ulnar  Deformity noted  Splint applied today volarly.  Recommend open treatment internal fixation with DVR volar plate  Risk vs. benefit ratio surgical vs. nonsurgical treatment explained.  Patient agrees that surgery should be done with plating.  Patient Instructions: 1)  Surgery tomorrow 03/14/10 2)  Preop today at [facility] short stay center at 1:45pm, take this packet with you 3)  Come back Tuesday 03/18/10 for post op 1 in our office.  Appended Document: RE-CK WRIST/HUMANA/CAF

## 2011-01-27 NOTE — Assessment & Plan Note (Signed)
Summary: POST OP #1 LEFT WRIST SURG 03/14/10/HUMANA/BSF   Visit Type:  Follow-up Referring Provider:  ap er Primary Provider:  Dr. Dwana Melena  CC:  postop wrist.  History of Present Illness: 77 years old distal radius and ulnar fracture treated with DVR volar plate with open treatment internal fixation on March 18. Procedure OTIF with DVR volar plate left wrist.  Medication Hydrocodone, drives her crazy. Tylenol now helps some.  seems to be doing fine except for the medication issue  Wound looks good  Volar splint applied  Patient will go to hospital give removable splint come back on the 31st for staples out and x-rays    Allergies: No Known Drug Allergies   Impression & Recommendations:  Problem # 1:  CLOSED FRACTURE OF LOWER END OF RADIUS WITH ULNA (ICD-813.44) Assessment Comment Only  Orders: Physical Therapy Referral (PT) Post-Op Check (04540)  Patient Instructions: 1)  TYLENOL 1000MG  Q 8 HRS AS NEEDED  2)  AND USE IBUPROFEN 400 MG Q 4 HRS AS NEEDED  3)  GO TO HOSPITAL FOR REMOVABLE SPLINT  4)  31ST FOR XRAYS AND STAPLES out

## 2011-01-27 NOTE — Letter (Signed)
Summary: Historic Patient File  Historic Patient File   Imported By: Elvera Maria 03/14/2010 10:34:44  _____________________________________________________________________  External Attachment:    Type:   Image     Comment:   history form

## 2011-01-27 NOTE — Miscellaneous (Signed)
Summary: Rehab corresp Rec'd 05/13/10 [facility] re: #Visits  Rehab corresp [facility] re: #Visits   Imported By: Cammie Sickle 05/13/2010 13:43:29  _____________________________________________________________________  External Attachment:    Type:   Image     Comment:   External Document

## 2011-01-27 NOTE — Miscellaneous (Signed)
Summary: Rehab Report  Rehab Report   Imported By: Elvera Maria 03/28/2010 09:31:08  _____________________________________________________________________  External Attachment:    Type:   Image     Comment:   initial pt report

## 2011-01-27 NOTE — Progress Notes (Signed)
Summary: pt's daughtercalled possible wound infection   Phone Note Call from Patient   Caller: Patient Summary of Call: Patient's daughter called; states just checked on her mother; states her incision is possibly infected, said "it smells."  Advised Dr Romeo Apple is now out of office for next few days and Dr Hilda Lias is covering;  will call Dr Sanjuan Dame office for appointment. (Next scheduled appt here is 04/17/10).  Daughter's Cell Ph# [phone] Initial call taken by: Cammie Sickle,  April 10, 2010 5:25 PM

## 2011-01-27 NOTE — Assessment & Plan Note (Signed)
Summary: 1 M RE-CK LT WRIST/HAD XR 06/11/10 [facility]/HUMANA/CAF   Visit Type:  Follow-up Referring Provider:  ap er Primary Provider:  Dr. Dwana Melena  CC:  left wrist fracture.  History of Present Illness: 77 year old female 4 months after open treatment internal fixation of the LEFT wrist constipated by stiffness of the fingers hand and wrist presents back for followup Distal radius and ulnar fracture treated with DVR volar plate with open treatment internal fixation on March 18.  Recheck after PT.  review of systems complains of LEFT shoulder pain especially over the last 2 weeks which is relieved by 3 Tylenol per day  No loss of movement or motion deficit  As far as her LEFT hand goes to stiffness is resolving.  She has no pain at the fracture site.      Allergies: No Known Drug Allergies  Physical Exam  Extremities:  I do not have any therapy notes but passive motion at the MP joint is approximately 70 at the IP joints is approximately 75% of normal.  She has a negative impingement sign LEFT shoulder with a near maneuvers are positive with the Hawkins sign.  She has active forward elevation without weakness.  Her incision healed nicely over the LEFT wrist are no neurologic deficits and pulses are normal   Impression & Recommendations:  Problem # 1:  AFTERCARE FOLLOW SURGERY MUSCULOSKEL SYSTEM NEC (ICD-V58.78) AP lateral and lunate fossa view LEFT wrist  Fracture fixation is noted to be in good position and the fracture is healed with good alignment   Continue occupational therapy follow up with me in one month  Problem # 2:  SHOULDER PAIN (ICD-719.41)  do not detect a rotator cuff tear recommend continued Tylenol I will visit [place] again she comes back in a month  Orders: Est. Patient Level III (04540)  Other Orders: Wrist x-ray complete, minimum 3 views (98119)  Patient Instructions: 1)  finish your last visits of therapy  2)  f/u in 1 month

## 2011-01-27 NOTE — Progress Notes (Signed)
Summary: Refill antibiotic ?  Phone Note Other Incoming   Summary of Call: Heloise Purpura called for her mother Dawn Lee (03/06/1923).  Said she finished the antibiotic last night but when she took the bandage off the surgery site this morning it is still draining some.  Wants to know if she should refill the antibiotic.  She is scheduled to see you this Thursday [facility] [place] Wendi Snipes can be reached at [phone] or [phone] Initial call taken by: Jacklynn Ganong,  April 21, 2010 9:20 AM  Follow-up for Phone Call        refill  Follow-up by: Fuller Canada MD,  April 21, 2010 9:23 AM  Additional Follow-up for Phone Call Additional follow up Details #1::        Cari Caraway of doctor's reply Additional Follow-up by: Jacklynn Ganong,  April 21, 2010 10:02 AM

## 2011-01-27 NOTE — Miscellaneous (Signed)
Summary: Rehab Referral form  Rehab Referral form   Imported By: Jacklynn Ganong 04/22/2010 10:36:04  _____________________________________________________________________  External Attachment:    Type:   Image     Comment:   External Document

## 2011-01-27 NOTE — Assessment & Plan Note (Signed)
Summary: 4 WK RE-CK/XRAY LT WRIST/FX CARE/HUMANA/CAF  CC: fx care left wrist post op   Visit Type:  Follow-up Referring Provider:  ap er Primary Provider:  Dr. Dwana Melena  CC:  fx care left wrist post op.  History of Present Illness: I saw Dawn Lee in the office today for a followup visit.  She is a 77 years old woman with the complaint of:  post op, left wrist.  Xrays today after OT and taking brace off.  Distal radius and ulnar fracture treated with DVR volar plate with open treatment internal fixation on March 18.  Doing better, still some pain.  Tylenol for pain.  her incision looks good it is totally healed  Her x-rays show healed fracture alignment normal  She has intrinsic tightness and will need some more physical therapy  Followup a month to check range of motion    Allergies: No Known Drug Allergies   Other Orders: Post-Op Check (09811)  Patient Instructions: 1)  Please schedule a follow-up appointment in 1 month. 2)  continue the physical therapy

## 2011-01-27 NOTE — Letter (Signed)
Summary: External Correspondence  External Correspondence   Imported By: Elvera Maria 03/14/2010 10:35:46  _____________________________________________________________________  External Attachment:    Type:   Image     Comment:   POA papers

## 2011-01-27 NOTE — Assessment & Plan Note (Signed)
Summary: RE-CK/XRAY LT WRIST/FX CARE/HUMANA/CAF   Visit Type:  post op  Referring Provider:  ap er Primary Provider:  Dr. Dwana Melena  CC:  left wrist.  History of Present Illness: I saw Dawn Lee in the office today for a followup visit.  She is a 77 years old woman with the complaint of:  post op, left wrist.  Xrays today.  Distal radius and ulnar fracture treated with DVR volar plate with open treatment internal fixation on March 18.  postop week #8 mass, patient in therapy, progressing slowly.  current medication Tylenol  Exam shows continued contracture and tightness in the small joints of the hand with decreased pronation supination  I wrote a letter to support past action approval of her physical therapy/occupational therapy  X-rays AP lateral oblique of the distal radius fracture shows plate fixation in good position  Allergies: No Known Drug Allergies   Impression & Recommendations:  Problem # 1:  AFTERCARE FOLLOW SURGERY MUSCULOSKEL SYSTEM NEC (ICD-V58.78)  Orders: Post-Op Check (04540) Wrist x-ray complete, minimum 3 views (73110)  Problem # 2:  CLOSED FRACTURE OF LOWER END OF RADIUS WITH ULNA (ICD-813.44)  Orders: Post-Op Check (98119) Wrist x-ray complete, minimum 3 views (14782)  Patient Instructions: 1)  OT continue x 4 weeks  2)  take splint off 3)  come back in 4 weeks; xrays

## 2011-01-27 NOTE — Miscellaneous (Signed)
Summary: Rehab Report/PT progress note  Rehab Report/PT progress note   Imported By: Cammie Sickle 07/15/2010 11:31:38  _____________________________________________________________________  External Attachment:    Type:   Image     Comment:   External Document

## 2011-01-27 NOTE — Assessment & Plan Note (Signed)
Summary: 3 WK RE-CK LT WRIST/FX CARE/NEEDS XRAY/HUMANA/CAF   Visit Type:  Follow-up Referring Provider:  ap er Primary Provider:  Dr. Dwana Melena  CC:  3 week recheck left wrist.  History of Present Illness: This is postop day 25 for this 77 year old female who had open treatment internal fixation LEFT wrist with DVR plate.  Somehow she did not come in for her March 31 appointment for staple removal and x-ray  She is taking Tylenol for pain complains of some volar wrist pain  Wound looks reasonably well after staples are removed some slight erythema distally but no signs of infection or drainage  The x-rays were obtained including 3 views of the wrist fracture is healing in good position and the plate is in good position  Impression healing distal radius fracture with internal fixation.  Follow up in a week to recheck the wound continue splinting     Allergies: No Known Drug Allergies   Other Orders: Post-Op Check (63875) Wrist x-ray complete, minimum 3 views (64332)  Patient Instructions: 1)  f/u on the 21 st wound check  2)  keep splint on

## 2011-01-27 NOTE — Assessment & Plan Note (Signed)
Summary: 1 M RE-CK LT WRIST/HAD SURG MARCH 2011/HUMANA/CAF   Visit Type:  Follow-up Referring Provider:  ap er Primary Provider:  Dr. Dwana Melena  CC:  left wrist .  History of Present Illness: I saw Charlisa Buczek in the office today for a 1 month followup visit.  She is a 77 years old woman with the complaint of:  left wrist  Distal radius and ulnar fracture treated with DVR volar plate with open treatment internal fixation on March 18.  Medications: Tylenol   The patient can still seems still to me especially in the intrinsics.  She can get the MP joints down but not the IP joint  No wrist pain incision looks good  Recommend continue exercising follow up 3 months  Allergies: No Known Drug Allergies   Impression & Recommendations:  Problem # 1:  AFTERCARE FOLLOW SURGERY MUSCULOSKEL SYSTEM NEC (ICD-V58.78) Assessment Improved  Orders: Est. Patient Level II (47829)  Problem # 2:  CLOSED FRACTURE OF LOWER END OF RADIUS WITH ULNA (ICD-813.44) Assessment: Improved  Orders: Est. Patient Level II (56213)  Patient Instructions: 1)  continue the exercise program at home  2)  return in 3 months

## 2011-01-27 NOTE — Letter (Signed)
Summary: work note  work note   Imported By: Cammie Sickle 04/14/2010 06:49:38  _____________________________________________________________________  External Attachment:    Type:   Image     Comment:   External Document

## 2011-01-27 NOTE — Assessment & Plan Note (Signed)
Summary: 1 WK RE-CK WOUND/LT WRIST/HUMANA/CAF   Visit Type:  Follow-up Referring Provider:  ap er Primary Provider:  Dr. Dwana Melena  CC:  recheck left wrist post op.  History of Present Illness: I saw Zamyiah Granquist in the office today for a followup visit.  She is a 77 years old woman with the complaint of:  recheck on left wrist after hand therapy and antibiotics.  Distal radius and ulnar fracture treated with DVR volar plate with open treatment internal fixation on March 18.  Procedure OTIF with DVR volar plate left wrist.  Doing better,  had one round of ATBS.  MEDS: Tylenol   WOUND LOOKS BETTER  IT IS CLOSED AND HAS NO ERYTHEMA   STOP ANTIBIOTICS  RETURN FOR XRAYS AND ? REMOVE GRACE IN MAY  ~ 13 TH         Allergies: No Known Drug Allergies   Impression & Recommendations:  Problem # 1:  OTHER CLOSED FRACTURES OF DISTAL END OF RADIUS (ICD-813.42)  Problem # 2:  AFTERCARE FOLLOW SURGERY MUSCULOSKEL SYSTEM NEC (ICD-V58.78)  Orders: Post-Op Check (16109)  Patient Instructions: 1)  Doing better 2)  Continue hand therapy 3)  re check and xrays on the 17th of MAY  4)  BRACE STAY ON UNTIL MAY 18TH

## 2011-01-29 NOTE — Assessment & Plan Note (Signed)
Summary: left elbow hurting again/humana/bsf   Visit Type:  Follow-up Referring Provider:  ap er Primary Provider:  Dr. Dwana Melena  CC:  LEFT WRIST PAIN.  History of Present Illness: I saw Dawn Lee in the office today for a  followup visit.  She is a 77 years old woman with the complaint of:  left elbow pain.  Received injection left elbow last visit 11/13/10 and it helped, no brace, no ice,  heat makes it feel better.  medication Tylenol.    The patient is actually complaining of pain over the upper arm deltoid area LEFTshoulder with painful forward elevation.  The elbow pain is actually better       Allergies: No Known Drug Allergies  Physical Exam  Additional Exam:  well-developed well-nourished female grooming and hygiene are normal  Tenderness is in the anterolateral and lateral deltoid with painful forward elevation and positive impingement syndrome she does have active forward elevation of approximately 120 passive 150 with a painful arc of motion her shoulder remained stable.  Skin is intact.  Sensation is normal.  LEFT elbow no tender   Impression & Recommendations:  Problem # 1:  SHOULDER PAIN (ICD-719.41)  injection LEFT  Orders: Est. Patient Level III (29562) Joint Aspirate / Injection, Large (20610) Depo- Medrol 40mg  (J1030) Verbal consent obtained/The left shoulder was injected with depomedrol 40mg /cc and sensorcaine .25% . There were no complications  Patient Instructions: 1)  You have received an injection of cortisone today. You may experience increased pain at the injection site. Apply ice pack to the area for 20 minutes every 2 hours and take 2 xtra strength tylenol every 8 hours. This increased pain will usually resolve in 24 hours. The injection will take effect in 3-10 days.  2)  call in 2 weeks if not improved    Orders Added: 1)  Est. Patient Level III [13086] 2)  Joint Aspirate / Injection, Large [20610] 3)  Depo- Medrol 40mg   [J1030]

## 2011-03-22 LAB — BASIC METABOLIC PANEL
BUN: 17 mg/dL (ref 6–23)
Chloride: 105 mEq/L (ref 96–112)
GFR calc non Af Amer: 53 mL/min — ABNORMAL LOW (ref >60)
Glucose, Bld: 101 mg/dL — ABNORMAL HIGH (ref 70–99)
Potassium: 3.9 mEq/L (ref 3.5–5.1)
Sodium: 138 mEq/L (ref 135–145)

## 2011-03-22 LAB — CBC
HCT: 33 % — ABNORMAL LOW (ref 36.0–46.0)
Hemoglobin: 11 g/dL — ABNORMAL LOW (ref 12.0–15.0)
MCV: 90.4 fL (ref 78.0–100.0)
Platelets: 192 10*3/uL (ref 150–400)
RDW: 14.1 % (ref 11.5–15.5)

## 2011-04-01 LAB — CBC
HCT: 33 % — ABNORMAL LOW (ref 36.0–46.0)
HCT: 33.2 % — ABNORMAL LOW (ref 36.0–46.0)
HCT: 34.8 % — ABNORMAL LOW (ref 36.0–46.0)
Hemoglobin: 11 g/dL — ABNORMAL LOW (ref 12.0–15.0)
MCHC: 33.1 g/dL (ref 30.0–36.0)
MCHC: 33.4 g/dL (ref 30.0–36.0)
MCV: 89.3 fL (ref 78.0–100.0)
Platelets: 201 10*3/uL (ref 150–400)
Platelets: 207 10*3/uL (ref 150–400)
RDW: 14.4 % (ref 11.5–15.5)
RDW: 14.4 % (ref 11.5–15.5)
RDW: 14.4 % (ref 11.5–15.5)
WBC: 6.8 10*3/uL (ref 4.0–10.5)
WBC: 8.2 10*3/uL (ref 4.0–10.5)

## 2011-04-01 LAB — URINE CULTURE

## 2011-04-01 LAB — BASIC METABOLIC PANEL
CO2: 25 mEq/L (ref 19–32)
Chloride: 108 mEq/L (ref 96–112)
Glucose, Bld: 100 mg/dL — ABNORMAL HIGH (ref 70–99)
Potassium: 3.7 mEq/L (ref 3.5–5.1)
Sodium: 138 mEq/L (ref 135–145)

## 2011-04-01 LAB — CARDIAC PANEL(CRET KIN+CKTOT+MB+TROPI)
Relative Index: INVALID (ref 0.0–2.5)
Total CK: 51 U/L (ref 7–177)
Troponin I: 0.07 ng/mL — ABNORMAL HIGH (ref 0.00–0.06)

## 2011-04-01 LAB — DIFFERENTIAL
Basophils Absolute: 0 10*3/uL (ref 0.0–0.1)
Basophils Absolute: 0 10*3/uL (ref 0.0–0.1)
Basophils Absolute: 0 10*3/uL (ref 0.0–0.1)
Basophils Relative: 0 % (ref 0–1)
Basophils Relative: 1 % (ref 0–1)
Eosinophils Relative: 2 % (ref 0–5)
Eosinophils Relative: 2 % (ref 0–5)
Lymphocytes Relative: 41 % (ref 12–46)
Monocytes Absolute: 0.6 10*3/uL (ref 0.1–1.0)
Monocytes Absolute: 0.7 10*3/uL (ref 0.1–1.0)
Monocytes Absolute: 0.8 10*3/uL (ref 0.1–1.0)
Monocytes Relative: 9 % (ref 3–12)
Neutro Abs: 3.2 10*3/uL (ref 1.7–7.7)
Neutro Abs: 4.1 10*3/uL (ref 1.7–7.7)

## 2011-04-01 LAB — BRAIN NATRIURETIC PEPTIDE: Pro B Natriuretic peptide (BNP): 65.5 pg/mL (ref 0.0–100.0)

## 2011-04-01 LAB — COMPREHENSIVE METABOLIC PANEL
ALT: 10 U/L (ref 0–35)
AST: 18 U/L (ref 0–37)
Albumin: 3 g/dL — ABNORMAL LOW (ref 3.5–5.2)
Albumin: 3.3 g/dL — ABNORMAL LOW (ref 3.5–5.2)
Alkaline Phosphatase: 40 U/L (ref 39–117)
BUN: 25 mg/dL — ABNORMAL HIGH (ref 6–23)
BUN: 31 mg/dL — ABNORMAL HIGH (ref 6–23)
Chloride: 104 mEq/L (ref 96–112)
Chloride: 109 mEq/L (ref 96–112)
Creatinine, Ser: 1.1 mg/dL (ref 0.4–1.2)
GFR calc Af Amer: 43 mL/min — ABNORMAL LOW (ref >60)
Potassium: 3.3 mEq/L — ABNORMAL LOW (ref 3.5–5.1)
Sodium: 136 mEq/L (ref 135–145)
Total Bilirubin: 0.4 mg/dL (ref 0.3–1.2)
Total Bilirubin: 0.6 mg/dL (ref 0.3–1.2)
Total Protein: 6.1 g/dL (ref 6.0–8.3)

## 2011-04-01 LAB — PROTIME-INR: INR: 1.07 (ref 0.00–1.49)

## 2011-04-01 LAB — URINALYSIS, ROUTINE W REFLEX MICROSCOPIC
Bilirubin Urine: NEGATIVE
Glucose, UA: NEGATIVE mg/dL
Hgb urine dipstick: NEGATIVE
Ketones, ur: NEGATIVE mg/dL
Nitrite: NEGATIVE
Protein, ur: NEGATIVE mg/dL
Specific Gravity, Urine: 1.015 (ref 1.005–1.030)
Urobilinogen, UA: 0.2 mg/dL (ref 0.0–1.0)
pH: 5.5 (ref 5.0–8.0)

## 2011-04-01 LAB — URINE MICROSCOPIC-ADD ON

## 2011-04-01 LAB — TSH: TSH: 0.507 u[IU]/mL (ref 0.350–4.500)

## 2011-04-01 LAB — POCT CARDIAC MARKERS
CKMB, poc: <1 ng/mL — ABNORMAL LOW (ref 1.0–8.0)
Myoglobin, poc: 110 ng/mL (ref 12–200)

## 2011-04-01 LAB — APTT: aPTT: 29 seconds (ref 24–37)

## 2011-04-01 LAB — LIPID PANEL
HDL: 36 mg/dL — ABNORMAL LOW (ref >39)
Total CHOL/HDL Ratio: 3.2 RATIO
VLDL: 20 mg/dL (ref 0–40)

## 2011-04-10 ENCOUNTER — Encounter: Payer: Self-pay | Admitting: Orthopedic Surgery

## 2011-04-21 ENCOUNTER — Other Ambulatory Visit (HOSPITAL_COMMUNITY): Payer: Self-pay | Admitting: Internal Medicine

## 2011-04-21 DIAGNOSIS — Z139 Encounter for screening, unspecified: Secondary | ICD-10-CM

## 2011-04-30 ENCOUNTER — Ambulatory Visit (INDEPENDENT_AMBULATORY_CARE_PROVIDER_SITE_OTHER): Payer: Medicare FFS | Admitting: Orthopedic Surgery

## 2011-04-30 DIAGNOSIS — M7710 Lateral epicondylitis, unspecified elbow: Secondary | ICD-10-CM

## 2011-04-30 MED ORDER — METHYLPREDNISOLONE ACETATE 40 MG/ML IJ SUSP: 40.0000 mg | Freq: Once | INTRAMUSCULAR | Status: DC

## 2011-04-30 NOTE — Progress Notes (Signed)
77 year old female still complains of lateral pain over the elbow  She has point tenderness there  Review of systems shoulder is not hurting her today  Exam shows tenderness over the lateral epicondyle painful wrist extension pain against resistance with wrist extension and elbow extension.  Elbow range of motion is normal and elbow stability is normal.  No numbness or tingling excellent pulse in perfusion  Injected lateral upper condyle followup as needed

## 2011-05-05 ENCOUNTER — Ambulatory Visit (HOSPITAL_COMMUNITY)
Admission: RE | Admit: 2011-05-05 | Discharge: 2011-05-05 | Disposition: A | Discharge status: Home / Self Care | Payer: Medicare FFS | Source: Ambulatory Visit | Attending: Internal Medicine | Admitting: Internal Medicine

## 2011-05-05 DIAGNOSIS — Z139 Encounter for screening, unspecified: Secondary | ICD-10-CM

## 2011-05-05 DIAGNOSIS — Z1231 Encounter for screening mammogram for malignant neoplasm of breast: Secondary | ICD-10-CM | POA: Insufficient documentation

## 2011-05-12 NOTE — Assessment & Plan Note (Signed)
[name]                       [name] OFFICE NOTE   Dawn Lee, Dawn Lee                   MRN:          [id number]  DATE:02/23/2008                            DOB:          April 17, 1923    IDENTIFICATION:  This is a 77 year old woman who is brought in by her  daughter today for continued cardiac care   The patient has no known history of coronary artery disease and had a  Myoview scan previously (ordered by Dionicio Stall in 2006) that showed  normal perfusion.   Talking to the patient and her daughter today, biggest concern is  intermittent left arm pain.  This can come on with and without activity.  It has been going on for awhile, not sure if it is getting any worse.  Also has episodes of left jaw pain.  On talking to the patient (who is a  relatively difficult historian), there may be some association with jaw  movement, it is hard to say   Her breathing has been okay.  She is slowing down some as the daughter  notes.  Also troubled by headaches radiating from the nose, radiating up  the top of her head towards the back, being evaluated for this.   Her current medicines include Fosamax q. week, Celebrex 200 p.r.n.,  Mevacor 20, Prevacid 30 vitamin C, Centrum Silver, aspirin 81,  amlodipine five b.i.d. and Avelox 400 daily (trial for possible sinus  infection).   PHYSICAL EXAM:  On exam the patient is in no distress.  Blood pressure  134/68, pulse is 99 and regular.  Weight 150 down from 159 in 2006.  LUNGS are clear.  CARDIAC exam regular rate and rhythm, S1-S2 no murmurs.  ABDOMEN:  Benign.  No masses.  EXTREMITIES:  No edema.  In movement of her left arm, there is no pain  produced.  There is slightly decreased range of motion in left shoulder,  but again without pain.   LABORATORY DATA:  Done in November 2007.  Her HDL was 61, LDL of 142.  Total cholesterol of 236.   12-lead EKG normal sinus rhythm, 99 beats per  minute.   IMPRESSION:  1. Arm pain, jaw pain.  I am not convinced this is an anginal      equivalent.  Again with her age and her cholesterol she has got      risks for coronary disease but  I do not sense that there is a      progression or association with anything for the symptoms.  If her      daughter or she notes a progression in association with activity I      would be happy to see with scheduled further testing but I would      hold for now.  2. Dyslipidemia.  We will set her up for a fasting lipid panel and be      back in touch with her   I have not set a definite followup.  I'd be happy to see her as needed.     [name], MD, [facility]  Electronically Signed  PVR/MedQ  DD: 02/23/2008  DT: 02/24/2008  Job #: [id number]   cc:   Patrica Duel, M.D.

## 2011-05-15 NOTE — Procedures (Signed)
Dawn Lee, Dawn Lee            ACCOUNT NO.:  [id number]   MEDICAL RECORD NO.:  [id number]          PATIENT TYPE:  REC   LOCATION:  RAD                           FACILITY:  [facility]   PHYSICIAN:  Vida Roller, M.D.   DATE OF BIRTH:  20-Jan-1923   DATE OF PROCEDURE:  02/13/2005  DATE OF DISCHARGE:                                    STRESS TEST   HISTORY:  Dawn Lee is a 77 year old female with no known coronary disease  who complains of dyspnea on exertion and occasional left arm discomfort.  A  Cardiolite in September 2001 revealed no ischemia and an EF of 59%.   BASELINE DATA:  EKG revealed a sinus rhythm at 62 beats/minute with  nonspecific ST abnormalities.  Poor R wave progression.  Blood pressure is  150/78.   DESCRIPTION OF PROCEDURE:  The patient exercised for a total of five minutes  to Bruce protocol stage II with 7.02 METS.  Maximum heart rate was 121  beats/minute, which is 86% of predicted maximum.  Maximum blood pressure was  188/72 and resolved down to 148/68 in recovery.  The patient reported left  arm discomfort, which resolved in recovery.  Shortness of breath resolved in  recovery.  The EKG revealed no arrhythmias and no ischemic changes.   The final images and results are pending M.D. review.      AB/MEDQ  D:  02/13/2005  T:  02/13/2005  Job:  [id number]

## 2011-05-15 NOTE — Op Note (Signed)
NAMEADISA, Dawn Lee            ACCOUNT NO.:  [id number]   MEDICAL RECORD NO.:  [id number]          PATIENT TYPE:  AMB   LOCATION:  DAY                           FACILITY:  [facility]   PHYSICIAN:  Lionel December, M.D.    DATE OF BIRTH:  April 12, 1923   DATE OF PROCEDURE:  04/14/2006  DATE OF DISCHARGE:                                 OPERATIVE REPORT   PROCEDURE:  Colonoscopy.   INDICATIONS:  [name] is an 77 year old Caucasian female who is here for  a high-risk screening colonoscopy.  Her last exam was in 1999; at that time  she told us her brother had colon cancer, but she does not remember it.  However, she opted not to come back, 5 years later as planned.  She does  give history of colon carcinoma second-degree relative.  She has occasional  hematochezia felt to be secondary to hemorrhoids.  Procedure and risks were  reviewed the patient, and informed consent was obtained.   MEDS FOR CONSCIOUS SEDATION:  Demerol 50 mg IV, Versed 3 mg IV.   FINDINGS:  Procedure performed in endoscopy suite.  The patient's vital  signs and O2 saturations were monitored during procedure and remained  stable.  The patient was placed in the left lateral recumbent position and  rectal examination performed.  No abnormality noted on external or digital  exam.  Olympus videoscope was placed in the rectum and advanced under vision  into sigmoid colon and beyond.  Preparation was excellent.  Scope was passed  into cecum which was identified by ileocecal valve and appendiceal orifice.  A short segment of TI was also examined and was normal.  As the scope was  withdrawn, colonic mucosa was examined for the second time and was normal  throughout.  Rectal mucosa similarly was normal.  The scope was retroflexed  to examine anorectal junction and small hemorrhoids were noted below the  dentate line.  Endoscope was straightened and withdrawn.  The patient  tolerated the procedure well.   FINAL DIAGNOSIS:  Small  external hemorrhoids, otherwise normal colonoscopy.   RECOMMENDATIONS:  1.  She will resume her usual meds including ASA.  2.  She should continue yearly Hemoccults.  3.  She would not need another colonoscopy for least 10 years unless she      would have new symptoms.      Lionel December, M.D.  Electronically Signed     NR/MEDQ  D:  04/14/2006  T:  04/14/2006  Job:  [id number]   cc:   Patrica Duel, M.D.  Fax: [phone]

## 2011-06-11 ENCOUNTER — Ambulatory Visit (HOSPITAL_COMMUNITY)
Admission: RE | Admit: 2011-06-11 | Discharge: 2011-06-11 | Disposition: A | Discharge status: Home / Self Care | Payer: Medicare FFS | Source: Ambulatory Visit | Attending: Internal Medicine | Admitting: Internal Medicine

## 2011-06-11 ENCOUNTER — Other Ambulatory Visit (HOSPITAL_COMMUNITY): Payer: Self-pay | Admitting: Internal Medicine

## 2011-06-11 DIAGNOSIS — R0789 Other chest pain: Secondary | ICD-10-CM | POA: Insufficient documentation

## 2011-06-11 DIAGNOSIS — W19XXXA Unspecified fall, initial encounter: Secondary | ICD-10-CM

## 2011-06-11 DIAGNOSIS — S298XXA Other specified injuries of thorax, initial encounter: Secondary | ICD-10-CM | POA: Insufficient documentation

## 2011-06-11 DIAGNOSIS — W1809XA Striking against other object with subsequent fall, initial encounter: Secondary | ICD-10-CM | POA: Insufficient documentation

## 2011-11-12 ENCOUNTER — Encounter: Payer: Self-pay | Admitting: Orthopedic Surgery

## 2011-11-12 ENCOUNTER — Ambulatory Visit (INDEPENDENT_AMBULATORY_CARE_PROVIDER_SITE_OTHER): Payer: Medicare FFS | Admitting: Orthopedic Surgery

## 2011-11-12 DIAGNOSIS — M76899 Other specified enthesopathies of unspecified lower limb, excluding foot: Secondary | ICD-10-CM

## 2011-11-12 DIAGNOSIS — M7071 Other bursitis of hip, right hip: Secondary | ICD-10-CM

## 2011-11-12 NOTE — Patient Instructions (Signed)
You have received a steroid shot. 15% of patients experience increased pain at the injection site with in the next 24 hours. This is best treated with ice and tylenol extra strength 2 tabs every 8 hours. If you are still having pain please call the office.    

## 2011-11-12 NOTE — Progress Notes (Signed)
Dr Margo Aye has requested a consultation.   The RIGHT hip.  77 yo female , fell 3-4 weeks ago, status post internal fixation, RIGHT hip with cannulated screws in 1985 did well up until this point.  Complaint of burning 6/10. Constant pain associated with swelling, worse with standing and walking, better with a heating pad and Tylenol.  Review of systems, cough, and snoring, easy bruising, seasonal allergy, unsteady gait.  Past Medical History  Diagnosis Date  . Hypertension   . Reflux   . High cholesterol   . DJD (degenerative joint disease)   . GERD (gastroesophageal reflux disease)   . Dementia    Past Surgical History  Procedure Date  . Pins in right hip   . Pins and plate in left wrist    Physical Exam(12) GENERAL: normal development   CDV: pulses are normal   Skin: normal  Lymph: nodes were not palpable/normal  Psychiatric: awake, alert and oriented  Neuro: normal sensation  MSK Cane for ambulation 1 Tenderness swelling, over the RIGHT hip 2 Normal range of motion 3 Normal strength 4 No instability 5 Negative straight leg raise   Imaging: Separate x-ray report AP, lateral, RIGHT hip 3 screws are noted fixating previously fractured RIGHT femoral neck. No complications from the screws. Impression normal hip x-ray with 3 cannulated screws  Assessment: Bursitis either posttraumatic or posttraumatic hematoma    Plan: Inject RIGHT hip.  Separate report for injection.  Current consent was obtained. Timeout was taken to confirm RIGHT hip injection.  Under sterile conditions, 40 mg of Depo-Medrol and 1% lidocaine injection, RIGHT hip greater trochanteric bursa.  No complications

## 2012-03-30 ENCOUNTER — Other Ambulatory Visit (HOSPITAL_COMMUNITY): Payer: Self-pay | Admitting: Internal Medicine

## 2012-03-30 DIAGNOSIS — Z139 Encounter for screening, unspecified: Secondary | ICD-10-CM

## 2012-05-06 ENCOUNTER — Ambulatory Visit (HOSPITAL_COMMUNITY): Payer: Medicare Other

## 2012-05-09 ENCOUNTER — Ambulatory Visit (HOSPITAL_COMMUNITY)
Admission: RE | Admit: 2012-05-09 | Discharge: 2012-05-09 | Disposition: A | Discharge status: Home / Self Care | Payer: Medicare Other | Source: Ambulatory Visit | Attending: Internal Medicine | Admitting: Internal Medicine

## 2012-05-09 DIAGNOSIS — Z139 Encounter for screening, unspecified: Secondary | ICD-10-CM

## 2012-05-09 DIAGNOSIS — Z1231 Encounter for screening mammogram for malignant neoplasm of breast: Secondary | ICD-10-CM | POA: Insufficient documentation

## 2012-05-10 ENCOUNTER — Ambulatory Visit (INDEPENDENT_AMBULATORY_CARE_PROVIDER_SITE_OTHER): Payer: Medicare Other | Admitting: Orthopedic Surgery

## 2012-05-10 ENCOUNTER — Encounter: Payer: Self-pay | Admitting: Orthopedic Surgery

## 2012-05-10 VITALS — BP 140/76 | Ht 68.0 in | Wt 166.0 lb

## 2012-05-10 DIAGNOSIS — M25559 Pain in unspecified hip: Secondary | ICD-10-CM

## 2012-05-10 DIAGNOSIS — M7071 Other bursitis of hip, right hip: Secondary | ICD-10-CM

## 2012-05-10 NOTE — Progress Notes (Signed)
Patient ID: CASSEY BACIGALUPO, female   DOB: Jul 19, 1923, 78 y.o.   MRN: [id number] Chief Complaint  Patient presents with  . Hip Pain    right hip pain, requests injection    Status post internal fixation, RIGHT hip approximately 20 years ago with cannulated screws. She was x-rayed in November of 2012 H. Are seen. At that time. She had an injection for bursitis in the bursitis has returned.  Chest tenderness over the RIGHT greater trochanter with swelling.  RIGHT hip injected for bursitis  Inject RIGHT hip bursa Hip  Injection Procedure Note  Pre-operative Diagnosis: right hip Bursitis Post-operative Diagnosis: same  Indications: pain  Anesthesia: ethyl chloride   Procedure Details   Verbal consent was obtained for the procedure. Time out was completed.The RIGHT greater trochanter was prepped with alcohol, followed by  Ethyl chloride spray and A 25 gauge needle was inserted into the area of maximal tenderness.  1ml 1% lidocaine and 1 ml of depomedrol  was then injected into the bursa . The needle was removed and the area cleansed and dressed.  Complications:  None; patient tolerated the procedure well.

## 2012-05-10 NOTE — Patient Instructions (Signed)
You have received a steroid shot. 15% of patients experience increased pain at the injection site with in the next 24 hours. This is best treated with ice and tylenol extra strength 2 tabs every 8 hours. If you are still having pain please call the office.   Apply ice and aspercreme 3 times a day for the next 2 weeks

## 2012-09-07 ENCOUNTER — Encounter: Payer: Self-pay | Admitting: Orthopedic Surgery

## 2012-09-07 ENCOUNTER — Ambulatory Visit (INDEPENDENT_AMBULATORY_CARE_PROVIDER_SITE_OTHER): Payer: Medicare Other | Admitting: Orthopedic Surgery

## 2012-09-07 VITALS — BP 106/60 | Ht 68.0 in | Wt 168.0 lb

## 2012-09-07 DIAGNOSIS — R229 Localized swelling, mass and lump, unspecified: Secondary | ICD-10-CM

## 2012-09-07 DIAGNOSIS — R2240 Localized swelling, mass and lump, unspecified lower limb: Secondary | ICD-10-CM | POA: Insufficient documentation

## 2012-09-07 NOTE — Progress Notes (Signed)
Patient ID: Dawn Lee, female   DOB: March 02, 1923, 78 y.o.   MRN: [id number] Chief Complaint  Patient presents with  . Follow-up    Still having hip pain.   FELL JAN 2013, MASS RIGHT HIP, PAIN SINCE THEN. HAS 2 INJECTIONS, 4 WEEKS OF CELEBREX, TYLENOL FOR 6 WEEKS , PAIN MEDICATION AND STILL HAS PAIN LATERAL THIGH RADIATES TO GROIN AND BACK ASSOC WITH RIGHT LEG GIVING OUT  ROS: NEG FOR PARESTHESIA IN RIGHT LOWER LEG   BP 106/60  Ht 5\' 8"  (1.727 m)  Wt 168 lb (76.204 kg)  BMI 25.54 kg/m2 RIGHT THIGH  There was an incision over the RIGHT hip from previous internal fixation with cannulated screws. Just posterior to that there is a mass in the soft tissue, which is tender. She has normal range of motion in the hip. No pain with straight leg raise. Muscle tone is normal. Distal neurovascular function is normal as well. She is ambulatory with a cane or a stable muscle tone is normal.  Previous x-rays shows 3 screws with washers in the RIGHT hip. No sign of hip arthritis. No sign of hardware failure. Fracture healed. No evidence of avascular necrosis.  Impression Mass, RIGHT hip.  Recommend MRI to evaluate mass.  Possibility of hardware related pain, versus referred pain from the degenerative scoliosis in the lumbar spine.  Follow up after MRI.

## 2012-09-07 NOTE — Patient Instructions (Signed)
MRI RIGHT HIP/THIGH

## 2012-09-08 ENCOUNTER — Other Ambulatory Visit: Payer: Self-pay | Admitting: Radiology

## 2012-09-08 DIAGNOSIS — M25551 Pain in right hip: Secondary | ICD-10-CM

## 2012-09-13 ENCOUNTER — Telehealth: Payer: Self-pay | Admitting: Radiology

## 2012-09-13 NOTE — Telephone Encounter (Signed)
Patient has MRI appointment at [facility] on 09-14-12 at 1:45. Patient has Susa Simmonds, authorization # [id number] and it expires on 10-27-12. Patient will follow up back here in the office for her results.

## 2012-09-14 ENCOUNTER — Ambulatory Visit (HOSPITAL_COMMUNITY)
Admission: RE | Admit: 2012-09-14 | Discharge: 2012-09-14 | Disposition: A | Discharge status: Home / Self Care | Payer: Medicare Other | Source: Ambulatory Visit | Attending: Orthopedic Surgery | Admitting: Orthopedic Surgery

## 2012-09-14 DIAGNOSIS — M25559 Pain in unspecified hip: Secondary | ICD-10-CM | POA: Insufficient documentation

## 2012-09-14 DIAGNOSIS — M25551 Pain in right hip: Secondary | ICD-10-CM

## 2012-09-21 ENCOUNTER — Ambulatory Visit (INDEPENDENT_AMBULATORY_CARE_PROVIDER_SITE_OTHER): Payer: Medicare Other | Admitting: Orthopedic Surgery

## 2012-09-21 ENCOUNTER — Encounter: Payer: Self-pay | Admitting: Orthopedic Surgery

## 2012-09-21 VITALS — BP 124/60 | Ht 68.0 in | Wt 168.0 lb

## 2012-09-21 DIAGNOSIS — R229 Localized swelling, mass and lump, unspecified: Secondary | ICD-10-CM

## 2012-09-21 DIAGNOSIS — R2240 Localized swelling, mass and lump, unspecified lower limb: Secondary | ICD-10-CM

## 2012-09-21 NOTE — Patient Instructions (Addendum)
Use heating pad or topical arthritis creams as needed

## 2012-09-21 NOTE — Progress Notes (Signed)
Patient ID: Dawn Lee, female   DOB: 07-30-1923, 78 y.o.   MRN: [id number] Chief Complaint  Patient presents with  . Follow-up    review MRI results right hip    Followup.  Imaging study. MRI. The MRI is reviewed with its corresponding report.  I am in agreement with the report that reads: No evidence of tumor. She still does have some pain over the right hip status post implantation of gamma type nail  My treatment plan is as follows: Ultracet for pain topical creams as needed followup as needed

## 2013-04-10 ENCOUNTER — Emergency Department (HOSPITAL_COMMUNITY): Payer: Medicare Other

## 2013-04-10 ENCOUNTER — Encounter (HOSPITAL_COMMUNITY): Payer: Self-pay

## 2013-04-10 ENCOUNTER — Emergency Department (HOSPITAL_COMMUNITY)
Admission: EM | Admit: 2013-04-10 | Discharge: 2013-04-10 | Disposition: A | Discharge status: Home / Self Care | Payer: Medicare Other | Attending: Emergency Medicine | Admitting: Emergency Medicine

## 2013-04-10 DIAGNOSIS — Y939 Activity, unspecified: Secondary | ICD-10-CM | POA: Insufficient documentation

## 2013-04-10 DIAGNOSIS — W19XXXA Unspecified fall, initial encounter: Secondary | ICD-10-CM

## 2013-04-10 DIAGNOSIS — F0390 Unspecified dementia without behavioral disturbance: Secondary | ICD-10-CM | POA: Insufficient documentation

## 2013-04-10 DIAGNOSIS — Z79899 Other long term (current) drug therapy: Secondary | ICD-10-CM | POA: Insufficient documentation

## 2013-04-10 DIAGNOSIS — X58XXXA Exposure to other specified factors, initial encounter: Secondary | ICD-10-CM | POA: Insufficient documentation

## 2013-04-10 DIAGNOSIS — R911 Solitary pulmonary nodule: Secondary | ICD-10-CM | POA: Insufficient documentation

## 2013-04-10 DIAGNOSIS — M549 Dorsalgia, unspecified: Secondary | ICD-10-CM

## 2013-04-10 DIAGNOSIS — IMO0002 Reserved for concepts with insufficient information to code with codable children: Secondary | ICD-10-CM | POA: Insufficient documentation

## 2013-04-10 DIAGNOSIS — M1990 Unspecified osteoarthritis, unspecified site: Secondary | ICD-10-CM | POA: Insufficient documentation

## 2013-04-10 DIAGNOSIS — I10 Essential (primary) hypertension: Secondary | ICD-10-CM | POA: Insufficient documentation

## 2013-04-10 DIAGNOSIS — Y9289 Other specified places as the place of occurrence of the external cause: Secondary | ICD-10-CM | POA: Insufficient documentation

## 2013-04-10 DIAGNOSIS — S0990XA Unspecified injury of head, initial encounter: Secondary | ICD-10-CM | POA: Insufficient documentation

## 2013-04-10 DIAGNOSIS — K219 Gastro-esophageal reflux disease without esophagitis: Secondary | ICD-10-CM | POA: Insufficient documentation

## 2013-04-10 DIAGNOSIS — S0993XA Unspecified injury of face, initial encounter: Secondary | ICD-10-CM | POA: Insufficient documentation

## 2013-04-10 DIAGNOSIS — S199XXA Unspecified injury of neck, initial encounter: Secondary | ICD-10-CM | POA: Insufficient documentation

## 2013-04-10 DIAGNOSIS — E7800 Pure hypercholesterolemia, unspecified: Secondary | ICD-10-CM | POA: Insufficient documentation

## 2013-04-10 LAB — URINALYSIS, ROUTINE W REFLEX MICROSCOPIC
Glucose, UA: NEGATIVE mg/dL
Ketones, ur: NEGATIVE mg/dL
Nitrite: NEGATIVE
Protein, ur: 30 mg/dL — AB

## 2013-04-10 LAB — URINE MICROSCOPIC-ADD ON

## 2013-04-10 NOTE — ED Notes (Signed)
Spoke with pt's daughter, Galen Daft [phone] and informed of Rx for Bactrim DS for UTI.  Verbalized understanding.  Requests Rx be called into [place].  Rx called in.

## 2013-04-10 NOTE — ED Notes (Signed)
Patient ambulated around nurses station in front of Dr Bebe Shaggy. Patient's gait slightly unsteady with cane. Patient given walker. Gait steady with walker.

## 2013-04-10 NOTE — ED Provider Notes (Signed)
History    This chart was scribed for Joya Gaskins, MD by Quintella Reichert, ED scribe.  This patient was seen in room [facility]14[facility]14 and the patient's care was started at ###.   CSN: [id number]  Arrival date & time 04/10/13  2952       Chief Complaint  Patient presents with  . Fall   Level 5 Caveat: Dementia   The history is provided by the patient, a caregiver and a relative. No language interpreter was used.   Dawn Lee is a 79 y.o. female brought to the Emergency Department due to an unwitnessed fall this morning.  Pt's caregiver states that she was sitting up on the floor in the bathroom when discovered this morning.   Pt reports a headache, neck pain, back pain.    Pt has history of dementia, but was at baseline when seen last night and is currently at baseline.  .   Pt denies abdominal pain, CP, back pain, leg pain. Pt has HTN that is currently uncontrolled with medication.  PCP aware.  Pt is not currently on anticoagulants.   Past Medical History  Diagnosis Date  . Hypertension   . Reflux   . High cholesterol   . DJD (degenerative joint disease)   . GERD (gastroesophageal reflux disease)   . Dementia     Past Surgical History  Procedure Laterality Date  . Pins in right hip    . Pins and plate in left wrist      Family History  Problem Relation Age of Onset  . Diabetes      family history   . Heart defect      family history   . Arthritis      family history     History  Substance Use Topics  . Smoking status: Never Smoker   . Smokeless tobacco: Not on file  . Alcohol Use: No    OB History   Grav Para Term Preterm Abortions TAB SAB Ect Mult Living                  Review of Systems  Unable to perform ROS: Dementia    Allergies  Penicillins  Home Medications   Current Outpatient Rx  Name  Route  Sig  Dispense  Refill  . acetaminophen (TYLENOL) 325 MG tablet   Oral   Take 650 mg by mouth every 6 (six) hours as needed.            . donepezil (ARICEPT) 5 MG tablet   Oral   Take 5 mg by mouth at bedtime.         . lovastatin (MEVACOR) 40 MG tablet   Oral   Take 40 mg by mouth at bedtime.           . memantine (NAMENDA) 10 MG tablet   Oral   Take 10 mg by mouth 2 (two) times daily.           . pantoprazole (PROTONIX) 40 MG tablet   Oral   Take 40 mg by mouth daily.             BP 181/63  Pulse 91  Temp(Src) 98.2 F (36.8 C) (Oral)  Resp 18  SpO2 99%  Physical Exam  Nursing note and vitals reviewed.  CONSTITUTIONAL: Well developed/well nourished HEAD: Normocephalic/atraumatic EYES: EOMI/PERRL ENMT: Mucous membranes moist NECK: supple no meningeal signs SPINE cervical spine tenderness, lumbar tenderness, no thoracic tenderness, No bruising/crepitance/stepoffs noted  to spine CV: S1/S2 noted, no murmurs/rubs/gallops noted LUNGS: Lungs are clear to auscultation bilaterally, no apparent distress CHEST: chest wall tenderness noted,  ABDOMEN: soft, nontender, no rebound or guarding GU:no cva tenderness NEURO: Pt is awake/alert, moves all extremitiesx4, pleasantly demented EXTREMITIES: pulses normal, full ROM, no deformity or tenderness noted in any extremity SKIN: warm, color normal PSYCH: no abnormalities of mood noted  ED Course  Procedures (including critical care time) Oxygen Saturation is 99% on room air, normal by my interpretation.    COORDINATION OF CARE: 10:29 AM-Discussed treatment plan which includes imaging and EKG to determine the presence of injuries with pt's family at bedside and they agreed to plan.   Pt's family declined bloodwork, stating she had regular bloodwork done 2 weeks ago.  Pt improved She is ambulatory at her baseline (walker) She is able to bear weight without any complaints She has sitter most of the days, but overnight is by herself and was found on floor but no evidence of prolonged downtime Family was resistant to labs, except for urine which was sent  for culture (pt without symptoms) I spoke to daughter abnormal xray and need for f/u with PCP concerning possible exploration of lung nodule     Labs Reviewed - No data to display Dg Chest 1 View  04/10/2013  *RADIOLOGY REPORT*  Clinical Data: Neck pain, chronic low back pain, fell this morning  CHEST - 1 VIEW  Comparison: 06/11/2011  Findings: Very tips of lung apices excluded but will be visualized on ordered CT cervical spine. Upper normal heart size. Normal mediastinal contours pulmonary vascularity. Atherosclerotic calcification aorta. Slightly increased right suprahilar markings could related to superimposed anterior chest wall structures, mild infiltrate, nodule not completely excluded. Minimal atelectasis at left base. Underlying emphysematous changes. No pleural effusion or pneumothorax. Bones demineralized.  IMPRESSION: Emphysematous changes with left basilar atelectasis. Slightly increased right suprahilar markings could related to asymmetric chest wall structures or mild right upper lobe infiltrate, less likely nodule; follow-up chest radiographs recommended to ensure resolution.   Original Report Authenticated By: Ulyses Southward, M.D.    Dg Lumbar Spine Complete  04/10/2013  *RADIOLOGY REPORT*  Clinical Data: Larey Seat this morning, chronic low back pain  LUMBAR SPINE - COMPLETE 4+ VIEW  Comparison: None  Findings: Osseous demineralization. Five non-rib bearing lumbar vertebrae. Pronounced dextroconvex lumbar scoliosis apex L3. Multilevel disc space narrowing and endplate spur formation. Vacuum phenomenon L1-L2, L3-L4. Diffuse facet degenerative changes. Vertebral body heights maintained without fracture or subluxation. No bone destruction or definite spondylolysis. Scattered atherosclerotic calcifications. SI joints symmetric.  IMPRESSION: Multilevel degenerative disc and facet disease changes of the lumbar spine with pronounced dextroconvex scoliosis. Osseous demineralization. No acute  abnormalities.   Original Report Authenticated By: Ulyses Southward, M.D.    Ct Head Wo Contrast  04/10/2013  *RADIOLOGY REPORT*  Clinical Data:  Fall, dementia, posterior head and neck pain  CT HEAD WITHOUT CONTRAST CT CERVICAL SPINE WITHOUT CONTRAST  Technique:  Multidetector CT imaging of the head and cervical spine was performed following the standard protocol without intravenous contrast.  Multiplanar CT image reconstructions of the cervical spine were also generated.  Comparison:  CT head 11/19/2009  CT HEAD  Findings: Mild generalized atrophy. Normal ventricular morphology. No midline shift or mass effect. Mild small vessel chronic ischemic changes of deep cerebral white matter. No intracranial hemorrhage, mass lesion or evidence of acute infarction. No extra-axial fluid collections. Bones and sinuses unremarkable.  IMPRESSION: Atrophy with small vessel chronic ischemic changes of  deep cerebral white matter. No acute intracranial abnormalities.  CT CERVICAL SPINE  Findings: Few scattered motion artifacts. Minimal biapical scarring. Diffuse osseous demineralization. Visualized skull base intact. Multilevel facet degenerative changes. Question ankylosis left C3-C4 facet joint, potentially left C4-C5 as well. Multilevel disc space narrowing and endplate spur formation. Prevertebral soft tissues normal thickness. No acute fracture, subluxation, or bone destruction.  IMPRESSION: Osseous demineralization with multilevel degenerative disc and facet disease changes of the cervical spine. No acute abnormalities.   Original Report Authenticated By: Ulyses Southward, M.D.    Ct Cervical Spine Wo Contrast  04/10/2013  *RADIOLOGY REPORT*  Clinical Data:  Fall, dementia, posterior head and neck pain  CT HEAD WITHOUT CONTRAST CT CERVICAL SPINE WITHOUT CONTRAST  Technique:  Multidetector CT imaging of the head and cervical spine was performed following the standard protocol without intravenous contrast.  Multiplanar CT image  reconstructions of the cervical spine were also generated.  Comparison:  CT head 11/19/2009  CT HEAD  Findings: Mild generalized atrophy. Normal ventricular morphology. No midline shift or mass effect. Mild small vessel chronic ischemic changes of deep cerebral white matter. No intracranial hemorrhage, mass lesion or evidence of acute infarction. No extra-axial fluid collections. Bones and sinuses unremarkable.  IMPRESSION: Atrophy with small vessel chronic ischemic changes of deep cerebral white matter. No acute intracranial abnormalities.  CT CERVICAL SPINE  Findings: Few scattered motion artifacts. Minimal biapical scarring. Diffuse osseous demineralization. Visualized skull base intact. Multilevel facet degenerative changes. Question ankylosis left C3-C4 facet joint, potentially left C4-C5 as well. Multilevel disc space narrowing and endplate spur formation. Prevertebral soft tissues normal thickness. No acute fracture, subluxation, or bone destruction.  IMPRESSION: Osseous demineralization with multilevel degenerative disc and facet disease changes of the cervical spine. No acute abnormalities.   Original Report Authenticated By: Ulyses Southward, M.D.       MDM  Nursing notes including past medical history and social history reviewed and considered in documentation xrays reviewed and considered       Date: 04/10/2013  Rate: 72  Rhythm: normal sinus rhythm  QRS Axis: normal  Intervals: normal  ST/T Wave abnormalities: nonspecific ST changes  Conduction Disutrbances:first-degree A-V block      I personally performed the services described in this documentation, which was scribed in my presence. The recorded information has been reviewed and is accurate.      Joya Gaskins, MD 04/10/13 [phone]

## 2013-04-10 NOTE — ED Notes (Signed)
Per ems, pt was found this a.m by her caregiver in the floor in supine position.  Pt has hx of dementia.  Pt reports falling around 0840.  Pt c/o left hip pain.

## 2013-04-12 LAB — URINE CULTURE: Colony Count: >=100000

## 2013-04-13 ENCOUNTER — Telehealth (HOSPITAL_COMMUNITY): Payer: Self-pay | Admitting: Emergency Medicine

## 2013-05-02 ENCOUNTER — Other Ambulatory Visit (HOSPITAL_COMMUNITY): Payer: Self-pay | Admitting: Internal Medicine

## 2013-05-02 DIAGNOSIS — Z139 Encounter for screening, unspecified: Secondary | ICD-10-CM

## 2013-05-08 ENCOUNTER — Ambulatory Visit (HOSPITAL_COMMUNITY)
Admission: RE | Admit: 2013-05-08 | Discharge: 2013-05-08 | Disposition: A | Discharge status: Home / Self Care | Payer: Medicare Other | Source: Ambulatory Visit | Attending: Internal Medicine | Admitting: Internal Medicine

## 2013-05-08 ENCOUNTER — Other Ambulatory Visit (HOSPITAL_COMMUNITY): Payer: Self-pay | Admitting: Internal Medicine

## 2013-05-08 DIAGNOSIS — R05 Cough: Secondary | ICD-10-CM

## 2013-05-08 DIAGNOSIS — Z139 Encounter for screening, unspecified: Secondary | ICD-10-CM

## 2013-05-08 DIAGNOSIS — Z1231 Encounter for screening mammogram for malignant neoplasm of breast: Secondary | ICD-10-CM | POA: Insufficient documentation

## 2013-05-08 DIAGNOSIS — R059 Cough, unspecified: Secondary | ICD-10-CM | POA: Insufficient documentation

## 2014-05-01 ENCOUNTER — Other Ambulatory Visit (HOSPITAL_COMMUNITY): Payer: Self-pay | Admitting: Internal Medicine

## 2014-05-01 DIAGNOSIS — Z1231 Encounter for screening mammogram for malignant neoplasm of breast: Secondary | ICD-10-CM

## 2014-05-10 ENCOUNTER — Ambulatory Visit (HOSPITAL_COMMUNITY)
Admission: RE | Admit: 2014-05-10 | Discharge: 2014-05-10 | Disposition: A | Discharge status: Home / Self Care | Payer: Medicare HMO | Source: Ambulatory Visit | Attending: Internal Medicine | Admitting: Internal Medicine

## 2014-05-10 DIAGNOSIS — Z1231 Encounter for screening mammogram for malignant neoplasm of breast: Secondary | ICD-10-CM

## 2016-02-13 DIAGNOSIS — E782 Mixed hyperlipidemia: Secondary | ICD-10-CM | POA: Diagnosis not present

## 2016-02-13 DIAGNOSIS — I10 Essential (primary) hypertension: Secondary | ICD-10-CM | POA: Diagnosis not present

## 2016-02-13 DIAGNOSIS — R7301 Impaired fasting glucose: Secondary | ICD-10-CM | POA: Diagnosis not present

## 2016-02-19 DIAGNOSIS — E782 Mixed hyperlipidemia: Secondary | ICD-10-CM | POA: Diagnosis not present

## 2016-02-19 DIAGNOSIS — R142 Eructation: Secondary | ICD-10-CM | POA: Diagnosis not present

## 2016-02-19 DIAGNOSIS — R7301 Impaired fasting glucose: Secondary | ICD-10-CM | POA: Diagnosis not present

## 2016-02-19 DIAGNOSIS — I10 Essential (primary) hypertension: Secondary | ICD-10-CM | POA: Diagnosis not present

## 2016-02-19 DIAGNOSIS — F0390 Unspecified dementia without behavioral disturbance: Secondary | ICD-10-CM | POA: Diagnosis not present

## 2016-02-27 DIAGNOSIS — J129 Viral pneumonia, unspecified: Secondary | ICD-10-CM | POA: Diagnosis not present

## 2016-03-21 DIAGNOSIS — R05 Cough: Secondary | ICD-10-CM | POA: Diagnosis not present

## 2016-03-24 ENCOUNTER — Other Ambulatory Visit (HOSPITAL_COMMUNITY): Payer: Self-pay | Admitting: Internal Medicine

## 2016-03-24 ENCOUNTER — Ambulatory Visit (HOSPITAL_COMMUNITY)
Admission: RE | Admit: 2016-03-24 | Discharge: 2016-03-24 | Disposition: A | Discharge status: Home / Self Care | Payer: PPO | Source: Ambulatory Visit | Attending: Internal Medicine | Admitting: Internal Medicine

## 2016-03-24 DIAGNOSIS — R059 Cough, unspecified: Secondary | ICD-10-CM

## 2016-03-24 DIAGNOSIS — I517 Cardiomegaly: Secondary | ICD-10-CM | POA: Diagnosis not present

## 2016-03-24 DIAGNOSIS — R918 Other nonspecific abnormal finding of lung field: Secondary | ICD-10-CM | POA: Insufficient documentation

## 2016-03-24 DIAGNOSIS — R05 Cough: Secondary | ICD-10-CM | POA: Diagnosis not present

## 2016-04-07 ENCOUNTER — Encounter (INDEPENDENT_AMBULATORY_CARE_PROVIDER_SITE_OTHER): Payer: Self-pay | Admitting: *Deleted

## 2016-04-07 NOTE — Telephone Encounter (Signed)
This encounter was created in error - please disregard.

## 2016-08-24 DIAGNOSIS — E782 Mixed hyperlipidemia: Secondary | ICD-10-CM | POA: Diagnosis not present

## 2016-08-24 DIAGNOSIS — R7301 Impaired fasting glucose: Secondary | ICD-10-CM | POA: Diagnosis not present

## 2016-08-26 DIAGNOSIS — E782 Mixed hyperlipidemia: Secondary | ICD-10-CM | POA: Diagnosis not present

## 2016-08-26 DIAGNOSIS — K219 Gastro-esophageal reflux disease without esophagitis: Secondary | ICD-10-CM | POA: Diagnosis not present

## 2016-08-26 DIAGNOSIS — F0390 Unspecified dementia without behavioral disturbance: Secondary | ICD-10-CM | POA: Diagnosis not present

## 2016-08-26 DIAGNOSIS — I10 Essential (primary) hypertension: Secondary | ICD-10-CM | POA: Diagnosis not present

## 2016-08-26 DIAGNOSIS — R7303 Prediabetes: Secondary | ICD-10-CM | POA: Diagnosis not present

## 2016-12-04 DIAGNOSIS — Z0000 Encounter for general adult medical examination without abnormal findings: Secondary | ICD-10-CM | POA: Diagnosis not present

## 2017-01-25 DIAGNOSIS — K219 Gastro-esophageal reflux disease without esophagitis: Secondary | ICD-10-CM | POA: Diagnosis not present

## 2017-01-25 DIAGNOSIS — E669 Obesity, unspecified: Secondary | ICD-10-CM | POA: Diagnosis not present

## 2017-01-25 DIAGNOSIS — L989 Disorder of the skin and subcutaneous tissue, unspecified: Secondary | ICD-10-CM | POA: Diagnosis not present

## 2017-01-25 DIAGNOSIS — E782 Mixed hyperlipidemia: Secondary | ICD-10-CM | POA: Diagnosis not present

## 2017-01-25 DIAGNOSIS — R7303 Prediabetes: Secondary | ICD-10-CM | POA: Diagnosis not present

## 2017-01-25 DIAGNOSIS — F0390 Unspecified dementia without behavioral disturbance: Secondary | ICD-10-CM | POA: Diagnosis not present

## 2017-01-25 DIAGNOSIS — L988 Other specified disorders of the skin and subcutaneous tissue: Secondary | ICD-10-CM | POA: Diagnosis not present

## 2017-01-25 DIAGNOSIS — I10 Essential (primary) hypertension: Secondary | ICD-10-CM | POA: Diagnosis not present

## 2017-01-25 DIAGNOSIS — M6281 Muscle weakness (generalized): Secondary | ICD-10-CM | POA: Diagnosis not present

## 2017-01-27 DIAGNOSIS — I10 Essential (primary) hypertension: Secondary | ICD-10-CM | POA: Diagnosis not present

## 2017-01-27 DIAGNOSIS — F0390 Unspecified dementia without behavioral disturbance: Secondary | ICD-10-CM | POA: Diagnosis not present

## 2017-01-27 DIAGNOSIS — M6281 Muscle weakness (generalized): Secondary | ICD-10-CM | POA: Diagnosis not present

## 2017-01-27 DIAGNOSIS — E669 Obesity, unspecified: Secondary | ICD-10-CM | POA: Diagnosis not present

## 2017-01-27 DIAGNOSIS — L989 Disorder of the skin and subcutaneous tissue, unspecified: Secondary | ICD-10-CM | POA: Diagnosis not present

## 2017-01-27 DIAGNOSIS — K219 Gastro-esophageal reflux disease without esophagitis: Secondary | ICD-10-CM | POA: Diagnosis not present

## 2017-01-27 DIAGNOSIS — R0602 Shortness of breath: Secondary | ICD-10-CM | POA: Diagnosis not present

## 2017-01-27 DIAGNOSIS — R7303 Prediabetes: Secondary | ICD-10-CM | POA: Diagnosis not present

## 2017-01-27 DIAGNOSIS — E782 Mixed hyperlipidemia: Secondary | ICD-10-CM | POA: Diagnosis not present

## 2017-01-28 DIAGNOSIS — I10 Essential (primary) hypertension: Secondary | ICD-10-CM | POA: Diagnosis not present

## 2017-01-28 DIAGNOSIS — M6281 Muscle weakness (generalized): Secondary | ICD-10-CM | POA: Diagnosis not present

## 2017-01-28 DIAGNOSIS — E669 Obesity, unspecified: Secondary | ICD-10-CM | POA: Diagnosis not present

## 2017-01-28 DIAGNOSIS — N390 Urinary tract infection, site not specified: Secondary | ICD-10-CM | POA: Diagnosis not present

## 2017-01-28 DIAGNOSIS — Z5181 Encounter for therapeutic drug level monitoring: Secondary | ICD-10-CM | POA: Diagnosis not present

## 2017-01-28 DIAGNOSIS — K219 Gastro-esophageal reflux disease without esophagitis: Secondary | ICD-10-CM | POA: Diagnosis not present

## 2017-01-28 DIAGNOSIS — R7303 Prediabetes: Secondary | ICD-10-CM | POA: Diagnosis not present

## 2017-01-28 DIAGNOSIS — E782 Mixed hyperlipidemia: Secondary | ICD-10-CM | POA: Diagnosis not present

## 2017-01-28 DIAGNOSIS — F0390 Unspecified dementia without behavioral disturbance: Secondary | ICD-10-CM | POA: Diagnosis not present

## 2017-01-28 DIAGNOSIS — L989 Disorder of the skin and subcutaneous tissue, unspecified: Secondary | ICD-10-CM | POA: Diagnosis not present

## 2017-01-29 DIAGNOSIS — M6281 Muscle weakness (generalized): Secondary | ICD-10-CM | POA: Diagnosis not present

## 2017-01-29 DIAGNOSIS — E669 Obesity, unspecified: Secondary | ICD-10-CM | POA: Diagnosis not present

## 2017-01-29 DIAGNOSIS — R7303 Prediabetes: Secondary | ICD-10-CM | POA: Diagnosis not present

## 2017-01-29 DIAGNOSIS — K219 Gastro-esophageal reflux disease without esophagitis: Secondary | ICD-10-CM | POA: Diagnosis not present

## 2017-01-29 DIAGNOSIS — I10 Essential (primary) hypertension: Secondary | ICD-10-CM | POA: Diagnosis not present

## 2017-01-29 DIAGNOSIS — E782 Mixed hyperlipidemia: Secondary | ICD-10-CM | POA: Diagnosis not present

## 2017-01-29 DIAGNOSIS — L989 Disorder of the skin and subcutaneous tissue, unspecified: Secondary | ICD-10-CM | POA: Diagnosis not present

## 2017-01-29 DIAGNOSIS — F0390 Unspecified dementia without behavioral disturbance: Secondary | ICD-10-CM | POA: Diagnosis not present

## 2017-02-01 DIAGNOSIS — K219 Gastro-esophageal reflux disease without esophagitis: Secondary | ICD-10-CM | POA: Diagnosis not present

## 2017-02-01 DIAGNOSIS — M6281 Muscle weakness (generalized): Secondary | ICD-10-CM | POA: Diagnosis not present

## 2017-02-01 DIAGNOSIS — I10 Essential (primary) hypertension: Secondary | ICD-10-CM | POA: Diagnosis not present

## 2017-02-01 DIAGNOSIS — E669 Obesity, unspecified: Secondary | ICD-10-CM | POA: Diagnosis not present

## 2017-02-01 DIAGNOSIS — L989 Disorder of the skin and subcutaneous tissue, unspecified: Secondary | ICD-10-CM | POA: Diagnosis not present

## 2017-02-01 DIAGNOSIS — R7303 Prediabetes: Secondary | ICD-10-CM | POA: Diagnosis not present

## 2017-02-01 DIAGNOSIS — F0390 Unspecified dementia without behavioral disturbance: Secondary | ICD-10-CM | POA: Diagnosis not present

## 2017-02-01 DIAGNOSIS — E782 Mixed hyperlipidemia: Secondary | ICD-10-CM | POA: Diagnosis not present

## 2017-02-03 DIAGNOSIS — E782 Mixed hyperlipidemia: Secondary | ICD-10-CM | POA: Diagnosis not present

## 2017-02-03 DIAGNOSIS — R7303 Prediabetes: Secondary | ICD-10-CM | POA: Diagnosis not present

## 2017-02-03 DIAGNOSIS — F0390 Unspecified dementia without behavioral disturbance: Secondary | ICD-10-CM | POA: Diagnosis not present

## 2017-02-03 DIAGNOSIS — K219 Gastro-esophageal reflux disease without esophagitis: Secondary | ICD-10-CM | POA: Diagnosis not present

## 2017-02-03 DIAGNOSIS — I10 Essential (primary) hypertension: Secondary | ICD-10-CM | POA: Diagnosis not present

## 2017-02-03 DIAGNOSIS — M6281 Muscle weakness (generalized): Secondary | ICD-10-CM | POA: Diagnosis not present

## 2017-02-03 DIAGNOSIS — E669 Obesity, unspecified: Secondary | ICD-10-CM | POA: Diagnosis not present

## 2017-02-03 DIAGNOSIS — L989 Disorder of the skin and subcutaneous tissue, unspecified: Secondary | ICD-10-CM | POA: Diagnosis not present

## 2017-02-05 DIAGNOSIS — R7303 Prediabetes: Secondary | ICD-10-CM | POA: Diagnosis not present

## 2017-02-05 DIAGNOSIS — E782 Mixed hyperlipidemia: Secondary | ICD-10-CM | POA: Diagnosis not present

## 2017-02-05 DIAGNOSIS — K219 Gastro-esophageal reflux disease without esophagitis: Secondary | ICD-10-CM | POA: Diagnosis not present

## 2017-02-05 DIAGNOSIS — L989 Disorder of the skin and subcutaneous tissue, unspecified: Secondary | ICD-10-CM | POA: Diagnosis not present

## 2017-02-05 DIAGNOSIS — E669 Obesity, unspecified: Secondary | ICD-10-CM | POA: Diagnosis not present

## 2017-02-05 DIAGNOSIS — M6281 Muscle weakness (generalized): Secondary | ICD-10-CM | POA: Diagnosis not present

## 2017-02-05 DIAGNOSIS — F0390 Unspecified dementia without behavioral disturbance: Secondary | ICD-10-CM | POA: Diagnosis not present

## 2017-02-05 DIAGNOSIS — I10 Essential (primary) hypertension: Secondary | ICD-10-CM | POA: Diagnosis not present

## 2017-02-08 DIAGNOSIS — E669 Obesity, unspecified: Secondary | ICD-10-CM | POA: Diagnosis not present

## 2017-02-08 DIAGNOSIS — K219 Gastro-esophageal reflux disease without esophagitis: Secondary | ICD-10-CM | POA: Diagnosis not present

## 2017-02-08 DIAGNOSIS — E782 Mixed hyperlipidemia: Secondary | ICD-10-CM | POA: Diagnosis not present

## 2017-02-08 DIAGNOSIS — R7303 Prediabetes: Secondary | ICD-10-CM | POA: Diagnosis not present

## 2017-02-08 DIAGNOSIS — L989 Disorder of the skin and subcutaneous tissue, unspecified: Secondary | ICD-10-CM | POA: Diagnosis not present

## 2017-02-08 DIAGNOSIS — R7301 Impaired fasting glucose: Secondary | ICD-10-CM | POA: Diagnosis not present

## 2017-02-08 DIAGNOSIS — I10 Essential (primary) hypertension: Secondary | ICD-10-CM | POA: Diagnosis not present

## 2017-02-08 DIAGNOSIS — F0390 Unspecified dementia without behavioral disturbance: Secondary | ICD-10-CM | POA: Diagnosis not present

## 2017-02-08 DIAGNOSIS — M6281 Muscle weakness (generalized): Secondary | ICD-10-CM | POA: Diagnosis not present

## 2017-02-11 DIAGNOSIS — M6281 Muscle weakness (generalized): Secondary | ICD-10-CM | POA: Diagnosis not present

## 2017-02-11 DIAGNOSIS — R7303 Prediabetes: Secondary | ICD-10-CM | POA: Diagnosis not present

## 2017-02-11 DIAGNOSIS — F0390 Unspecified dementia without behavioral disturbance: Secondary | ICD-10-CM | POA: Diagnosis not present

## 2017-02-11 DIAGNOSIS — I10 Essential (primary) hypertension: Secondary | ICD-10-CM | POA: Diagnosis not present

## 2017-02-11 DIAGNOSIS — E669 Obesity, unspecified: Secondary | ICD-10-CM | POA: Diagnosis not present

## 2017-02-11 DIAGNOSIS — K219 Gastro-esophageal reflux disease without esophagitis: Secondary | ICD-10-CM | POA: Diagnosis not present

## 2017-02-11 DIAGNOSIS — L989 Disorder of the skin and subcutaneous tissue, unspecified: Secondary | ICD-10-CM | POA: Diagnosis not present

## 2017-02-11 DIAGNOSIS — E782 Mixed hyperlipidemia: Secondary | ICD-10-CM | POA: Diagnosis not present

## 2017-02-15 DIAGNOSIS — K219 Gastro-esophageal reflux disease without esophagitis: Secondary | ICD-10-CM | POA: Diagnosis not present

## 2017-02-15 DIAGNOSIS — F0390 Unspecified dementia without behavioral disturbance: Secondary | ICD-10-CM | POA: Diagnosis not present

## 2017-02-15 DIAGNOSIS — L989 Disorder of the skin and subcutaneous tissue, unspecified: Secondary | ICD-10-CM | POA: Diagnosis not present

## 2017-02-15 DIAGNOSIS — R7303 Prediabetes: Secondary | ICD-10-CM | POA: Diagnosis not present

## 2017-02-15 DIAGNOSIS — I10 Essential (primary) hypertension: Secondary | ICD-10-CM | POA: Diagnosis not present

## 2017-02-15 DIAGNOSIS — E669 Obesity, unspecified: Secondary | ICD-10-CM | POA: Diagnosis not present

## 2017-02-15 DIAGNOSIS — E782 Mixed hyperlipidemia: Secondary | ICD-10-CM | POA: Diagnosis not present

## 2017-02-15 DIAGNOSIS — M6281 Muscle weakness (generalized): Secondary | ICD-10-CM | POA: Diagnosis not present

## 2017-02-16 DIAGNOSIS — R7303 Prediabetes: Secondary | ICD-10-CM | POA: Diagnosis not present

## 2017-02-16 DIAGNOSIS — E782 Mixed hyperlipidemia: Secondary | ICD-10-CM | POA: Diagnosis not present

## 2017-02-16 DIAGNOSIS — N390 Urinary tract infection, site not specified: Secondary | ICD-10-CM | POA: Diagnosis not present

## 2017-02-16 DIAGNOSIS — Z9181 History of falling: Secondary | ICD-10-CM | POA: Diagnosis not present

## 2017-02-16 DIAGNOSIS — F0390 Unspecified dementia without behavioral disturbance: Secondary | ICD-10-CM | POA: Diagnosis not present

## 2017-02-16 DIAGNOSIS — K219 Gastro-esophageal reflux disease without esophagitis: Secondary | ICD-10-CM | POA: Diagnosis not present

## 2017-02-16 DIAGNOSIS — I10 Essential (primary) hypertension: Secondary | ICD-10-CM | POA: Diagnosis not present

## 2017-02-18 DIAGNOSIS — I10 Essential (primary) hypertension: Secondary | ICD-10-CM | POA: Diagnosis not present

## 2017-02-18 DIAGNOSIS — F0390 Unspecified dementia without behavioral disturbance: Secondary | ICD-10-CM | POA: Diagnosis not present

## 2017-02-18 DIAGNOSIS — L989 Disorder of the skin and subcutaneous tissue, unspecified: Secondary | ICD-10-CM | POA: Diagnosis not present

## 2017-02-18 DIAGNOSIS — R7303 Prediabetes: Secondary | ICD-10-CM | POA: Diagnosis not present

## 2017-02-18 DIAGNOSIS — E782 Mixed hyperlipidemia: Secondary | ICD-10-CM | POA: Diagnosis not present

## 2017-02-18 DIAGNOSIS — M6281 Muscle weakness (generalized): Secondary | ICD-10-CM | POA: Diagnosis not present

## 2017-02-18 DIAGNOSIS — K219 Gastro-esophageal reflux disease without esophagitis: Secondary | ICD-10-CM | POA: Diagnosis not present

## 2017-02-18 DIAGNOSIS — E669 Obesity, unspecified: Secondary | ICD-10-CM | POA: Diagnosis not present

## 2017-02-19 DIAGNOSIS — I10 Essential (primary) hypertension: Secondary | ICD-10-CM | POA: Diagnosis not present

## 2017-02-19 DIAGNOSIS — E782 Mixed hyperlipidemia: Secondary | ICD-10-CM | POA: Diagnosis not present

## 2017-02-19 DIAGNOSIS — K219 Gastro-esophageal reflux disease without esophagitis: Secondary | ICD-10-CM | POA: Diagnosis not present

## 2017-02-19 DIAGNOSIS — M6281 Muscle weakness (generalized): Secondary | ICD-10-CM | POA: Diagnosis not present

## 2017-02-19 DIAGNOSIS — E669 Obesity, unspecified: Secondary | ICD-10-CM | POA: Diagnosis not present

## 2017-02-19 DIAGNOSIS — L989 Disorder of the skin and subcutaneous tissue, unspecified: Secondary | ICD-10-CM | POA: Diagnosis not present

## 2017-02-19 DIAGNOSIS — R7303 Prediabetes: Secondary | ICD-10-CM | POA: Diagnosis not present

## 2017-02-19 DIAGNOSIS — F0390 Unspecified dementia without behavioral disturbance: Secondary | ICD-10-CM | POA: Diagnosis not present

## 2017-02-26 DIAGNOSIS — F0390 Unspecified dementia without behavioral disturbance: Secondary | ICD-10-CM | POA: Diagnosis not present

## 2017-03-23 DIAGNOSIS — M79675 Pain in left toe(s): Secondary | ICD-10-CM | POA: Diagnosis not present

## 2017-03-23 DIAGNOSIS — B351 Tinea unguium: Secondary | ICD-10-CM | POA: Diagnosis not present

## 2017-03-23 DIAGNOSIS — M79674 Pain in right toe(s): Secondary | ICD-10-CM | POA: Diagnosis not present

## 2017-03-24 DIAGNOSIS — K219 Gastro-esophageal reflux disease without esophagitis: Secondary | ICD-10-CM | POA: Diagnosis not present

## 2017-03-24 DIAGNOSIS — R531 Weakness: Secondary | ICD-10-CM | POA: Diagnosis not present

## 2017-03-24 DIAGNOSIS — F0390 Unspecified dementia without behavioral disturbance: Secondary | ICD-10-CM | POA: Diagnosis not present

## 2017-03-24 DIAGNOSIS — H04129 Dry eye syndrome of unspecified lacrimal gland: Secondary | ICD-10-CM | POA: Diagnosis not present

## 2017-03-24 DIAGNOSIS — M545 Low back pain: Secondary | ICD-10-CM | POA: Diagnosis not present

## 2017-04-05 DIAGNOSIS — R7301 Impaired fasting glucose: Secondary | ICD-10-CM | POA: Diagnosis not present

## 2017-04-05 DIAGNOSIS — F0390 Unspecified dementia without behavioral disturbance: Secondary | ICD-10-CM | POA: Diagnosis not present

## 2017-04-05 DIAGNOSIS — M6281 Muscle weakness (generalized): Secondary | ICD-10-CM | POA: Diagnosis not present

## 2017-04-05 DIAGNOSIS — M545 Low back pain: Secondary | ICD-10-CM | POA: Diagnosis not present

## 2017-04-05 DIAGNOSIS — K219 Gastro-esophageal reflux disease without esophagitis: Secondary | ICD-10-CM | POA: Diagnosis not present

## 2017-04-05 DIAGNOSIS — R2689 Other abnormalities of gait and mobility: Secondary | ICD-10-CM | POA: Diagnosis not present

## 2017-04-05 DIAGNOSIS — L988 Other specified disorders of the skin and subcutaneous tissue: Secondary | ICD-10-CM | POA: Diagnosis not present

## 2017-04-05 DIAGNOSIS — I10 Essential (primary) hypertension: Secondary | ICD-10-CM | POA: Diagnosis not present

## 2017-04-05 DIAGNOSIS — E782 Mixed hyperlipidemia: Secondary | ICD-10-CM | POA: Diagnosis not present

## 2017-04-08 DIAGNOSIS — F419 Anxiety disorder, unspecified: Secondary | ICD-10-CM | POA: Diagnosis not present

## 2017-04-08 DIAGNOSIS — F0390 Unspecified dementia without behavioral disturbance: Secondary | ICD-10-CM | POA: Diagnosis not present

## 2017-04-08 DIAGNOSIS — F338 Other recurrent depressive disorders: Secondary | ICD-10-CM | POA: Diagnosis not present

## 2017-04-21 DIAGNOSIS — M6281 Muscle weakness (generalized): Secondary | ICD-10-CM | POA: Diagnosis not present

## 2017-04-21 DIAGNOSIS — F0390 Unspecified dementia without behavioral disturbance: Secondary | ICD-10-CM | POA: Diagnosis not present

## 2017-04-21 DIAGNOSIS — K219 Gastro-esophageal reflux disease without esophagitis: Secondary | ICD-10-CM | POA: Diagnosis not present

## 2017-04-21 DIAGNOSIS — H04129 Dry eye syndrome of unspecified lacrimal gland: Secondary | ICD-10-CM | POA: Diagnosis not present

## 2017-04-21 DIAGNOSIS — R531 Weakness: Secondary | ICD-10-CM | POA: Diagnosis not present

## 2017-04-21 DIAGNOSIS — R600 Localized edema: Secondary | ICD-10-CM | POA: Diagnosis not present

## 2017-04-21 DIAGNOSIS — M545 Low back pain: Secondary | ICD-10-CM | POA: Diagnosis not present

## 2017-04-21 DIAGNOSIS — R2689 Other abnormalities of gait and mobility: Secondary | ICD-10-CM | POA: Diagnosis not present

## 2017-04-26 DIAGNOSIS — K219 Gastro-esophageal reflux disease without esophagitis: Secondary | ICD-10-CM | POA: Diagnosis not present

## 2017-04-26 DIAGNOSIS — H04129 Dry eye syndrome of unspecified lacrimal gland: Secondary | ICD-10-CM | POA: Diagnosis not present

## 2017-04-26 DIAGNOSIS — R531 Weakness: Secondary | ICD-10-CM | POA: Diagnosis not present

## 2017-04-26 DIAGNOSIS — M545 Low back pain: Secondary | ICD-10-CM | POA: Diagnosis not present

## 2017-04-29 DIAGNOSIS — F0390 Unspecified dementia without behavioral disturbance: Secondary | ICD-10-CM | POA: Diagnosis not present

## 2017-05-05 DIAGNOSIS — N390 Urinary tract infection, site not specified: Secondary | ICD-10-CM | POA: Diagnosis not present

## 2017-05-19 DIAGNOSIS — K219 Gastro-esophageal reflux disease without esophagitis: Secondary | ICD-10-CM | POA: Diagnosis not present

## 2017-05-19 DIAGNOSIS — F0390 Unspecified dementia without behavioral disturbance: Secondary | ICD-10-CM | POA: Diagnosis not present

## 2017-05-19 DIAGNOSIS — M545 Low back pain: Secondary | ICD-10-CM | POA: Diagnosis not present

## 2017-05-19 DIAGNOSIS — R600 Localized edema: Secondary | ICD-10-CM | POA: Diagnosis not present

## 2017-05-19 DIAGNOSIS — R531 Weakness: Secondary | ICD-10-CM | POA: Diagnosis not present

## 2017-05-19 DIAGNOSIS — R351 Nocturia: Secondary | ICD-10-CM | POA: Diagnosis not present

## 2017-05-19 DIAGNOSIS — H04129 Dry eye syndrome of unspecified lacrimal gland: Secondary | ICD-10-CM | POA: Diagnosis not present

## 2017-05-20 DIAGNOSIS — N390 Urinary tract infection, site not specified: Secondary | ICD-10-CM | POA: Diagnosis not present

## 2017-05-26 DIAGNOSIS — R351 Nocturia: Secondary | ICD-10-CM | POA: Diagnosis not present

## 2017-06-03 DIAGNOSIS — F0390 Unspecified dementia without behavioral disturbance: Secondary | ICD-10-CM | POA: Diagnosis not present

## 2017-06-08 DIAGNOSIS — B351 Tinea unguium: Secondary | ICD-10-CM | POA: Diagnosis not present

## 2017-06-08 DIAGNOSIS — M79674 Pain in right toe(s): Secondary | ICD-10-CM | POA: Diagnosis not present

## 2017-06-08 DIAGNOSIS — M79675 Pain in left toe(s): Secondary | ICD-10-CM | POA: Diagnosis not present

## 2017-06-09 DIAGNOSIS — R351 Nocturia: Secondary | ICD-10-CM | POA: Diagnosis not present

## 2017-06-09 DIAGNOSIS — M545 Low back pain: Secondary | ICD-10-CM | POA: Diagnosis not present

## 2017-06-09 DIAGNOSIS — K219 Gastro-esophageal reflux disease without esophagitis: Secondary | ICD-10-CM | POA: Diagnosis not present

## 2017-06-09 DIAGNOSIS — R600 Localized edema: Secondary | ICD-10-CM | POA: Diagnosis not present

## 2017-06-09 DIAGNOSIS — F0390 Unspecified dementia without behavioral disturbance: Secondary | ICD-10-CM | POA: Diagnosis not present

## 2017-06-09 DIAGNOSIS — H04129 Dry eye syndrome of unspecified lacrimal gland: Secondary | ICD-10-CM | POA: Diagnosis not present

## 2017-06-09 DIAGNOSIS — R531 Weakness: Secondary | ICD-10-CM | POA: Diagnosis not present

## 2017-06-19 ENCOUNTER — Emergency Department (HOSPITAL_COMMUNITY): Payer: PPO

## 2017-06-19 ENCOUNTER — Inpatient Hospital Stay (HOSPITAL_COMMUNITY)
Admission: EM | Admit: 2017-06-19 | Discharge: 2017-06-24 | DRG: 871 | Disposition: A | Discharge status: Skilled Nursing Facility | Payer: PPO | Attending: Internal Medicine | Admitting: Internal Medicine

## 2017-06-19 ENCOUNTER — Encounter (HOSPITAL_COMMUNITY): Payer: Self-pay | Admitting: *Deleted

## 2017-06-19 DIAGNOSIS — R6520 Severe sepsis without septic shock: Secondary | ICD-10-CM | POA: Diagnosis present

## 2017-06-19 DIAGNOSIS — R7989 Other specified abnormal findings of blood chemistry: Secondary | ICD-10-CM | POA: Diagnosis present

## 2017-06-19 DIAGNOSIS — A419 Sepsis, unspecified organism: Secondary | ICD-10-CM | POA: Diagnosis not present

## 2017-06-19 DIAGNOSIS — N390 Urinary tract infection, site not specified: Secondary | ICD-10-CM

## 2017-06-19 DIAGNOSIS — G9341 Metabolic encephalopathy: Secondary | ICD-10-CM | POA: Diagnosis present

## 2017-06-19 DIAGNOSIS — A4189 Other specified sepsis: Principal | ICD-10-CM | POA: Diagnosis present

## 2017-06-19 DIAGNOSIS — R7881 Bacteremia: Secondary | ICD-10-CM

## 2017-06-19 DIAGNOSIS — R404 Transient alteration of awareness: Secondary | ICD-10-CM | POA: Diagnosis not present

## 2017-06-19 DIAGNOSIS — E7800 Pure hypercholesterolemia, unspecified: Secondary | ICD-10-CM | POA: Diagnosis present

## 2017-06-19 DIAGNOSIS — I482 Chronic atrial fibrillation: Secondary | ICD-10-CM | POA: Diagnosis not present

## 2017-06-19 DIAGNOSIS — E860 Dehydration: Secondary | ICD-10-CM | POA: Diagnosis present

## 2017-06-19 DIAGNOSIS — K219 Gastro-esophageal reflux disease without esophagitis: Secondary | ICD-10-CM | POA: Diagnosis present

## 2017-06-19 DIAGNOSIS — N179 Acute kidney failure, unspecified: Secondary | ICD-10-CM | POA: Diagnosis present

## 2017-06-19 DIAGNOSIS — I10 Essential (primary) hypertension: Secondary | ICD-10-CM | POA: Diagnosis not present

## 2017-06-19 DIAGNOSIS — R4182 Altered mental status, unspecified: Secondary | ICD-10-CM | POA: Diagnosis not present

## 2017-06-19 DIAGNOSIS — Z66 Do not resuscitate: Secondary | ICD-10-CM | POA: Diagnosis not present

## 2017-06-19 DIAGNOSIS — I248 Other forms of acute ischemic heart disease: Secondary | ICD-10-CM | POA: Diagnosis present

## 2017-06-19 DIAGNOSIS — R0602 Shortness of breath: Secondary | ICD-10-CM | POA: Diagnosis not present

## 2017-06-19 DIAGNOSIS — E870 Hyperosmolality and hypernatremia: Secondary | ICD-10-CM | POA: Diagnosis not present

## 2017-06-19 DIAGNOSIS — F0391 Unspecified dementia with behavioral disturbance: Secondary | ICD-10-CM | POA: Diagnosis not present

## 2017-06-19 DIAGNOSIS — R748 Abnormal levels of other serum enzymes: Secondary | ICD-10-CM | POA: Diagnosis not present

## 2017-06-19 DIAGNOSIS — J849 Interstitial pulmonary disease, unspecified: Secondary | ICD-10-CM | POA: Diagnosis not present

## 2017-06-19 DIAGNOSIS — F0390 Unspecified dementia without behavioral disturbance: Secondary | ICD-10-CM | POA: Diagnosis not present

## 2017-06-19 DIAGNOSIS — R05 Cough: Secondary | ICD-10-CM | POA: Diagnosis not present

## 2017-06-19 DIAGNOSIS — R778 Other specified abnormalities of plasma proteins: Secondary | ICD-10-CM | POA: Diagnosis present

## 2017-06-19 HISTORY — DX: Urinary tract infection, site not specified: N39.0

## 2017-06-19 HISTORY — DX: Sepsis, unspecified organism: A41.9

## 2017-06-19 LAB — CBC WITH DIFFERENTIAL/PLATELET
BASOS ABS: 0 10*3/uL (ref 0.0–0.1)
BASOS PCT: 0 %
Eosinophils Absolute: 0 10*3/uL (ref 0.0–0.7)
Eosinophils Relative: 0 %
HEMATOCRIT: 44.3 % (ref 36.0–46.0)
HEMOGLOBIN: 14 g/dL (ref 12.0–15.0)
Lymphocytes Relative: 3 %
Lymphs Abs: 0.5 10*3/uL — ABNORMAL LOW (ref 0.7–4.0)
MCH: 27.4 pg (ref 26.0–34.0)
MCHC: 31.6 g/dL (ref 30.0–36.0)
MCV: 86.7 fL (ref 78.0–100.0)
Monocytes Absolute: 0.2 10*3/uL (ref 0.1–1.0)
Monocytes Relative: 1 %
Neutro Abs: 14.5 10*3/uL — ABNORMAL HIGH (ref 1.7–7.7)
Neutrophils Relative %: 96 %
Platelets: 226 10*3/uL (ref 150–400)
RBC: 5.11 MIL/uL (ref 3.87–5.11)
RDW: 14.4 % (ref 11.5–15.5)
WBC: 15.3 10*3/uL — AB (ref 4.0–10.5)

## 2017-06-19 LAB — COMPREHENSIVE METABOLIC PANEL
ALBUMIN: 3.4 g/dL — AB (ref 3.5–5.0)
ALK PHOS: 87 U/L (ref 38–126)
ALT: 9 U/L — ABNORMAL LOW (ref 14–54)
ANION GAP: 13 (ref 5–15)
AST: 19 U/L (ref 15–41)
BILIRUBIN TOTAL: 0.5 mg/dL (ref 0.3–1.2)
BUN: 45 mg/dL — AB (ref 6–20)
CALCIUM: 8.6 mg/dL — AB (ref 8.9–10.3)
CO2: 24 mmol/L (ref 22–32)
CREATININE: 3.01 mg/dL — AB (ref 0.44–1.00)
Chloride: 116 mmol/L — ABNORMAL HIGH (ref 101–111)
GFR calc Af Amer: 14 mL/min — ABNORMAL LOW (ref >60)
GFR calc non Af Amer: 13 mL/min — ABNORMAL LOW (ref >60)
GLUCOSE: 113 mg/dL — AB (ref 65–99)
Potassium: 3.6 mmol/L (ref 3.5–5.1)
SODIUM: 153 mmol/L — AB (ref 135–145)
TOTAL PROTEIN: 7.8 g/dL (ref 6.5–8.1)

## 2017-06-19 LAB — URINALYSIS, ROUTINE W REFLEX MICROSCOPIC
BILIRUBIN URINE: NEGATIVE
GLUCOSE, UA: NEGATIVE mg/dL
KETONES UR: NEGATIVE mg/dL
NITRITE: NEGATIVE
PROTEIN: 100 mg/dL — AB
Specific Gravity, Urine: 1.014 (ref 1.005–1.030)
pH: 5 (ref 5.0–8.0)

## 2017-06-19 LAB — TROPONIN I: Troponin I: 0.06 ng/mL (ref <0.03)

## 2017-06-19 LAB — PROTIME-INR
INR: 1.11
Prothrombin Time: 14.3 seconds (ref 11.4–15.2)

## 2017-06-19 LAB — I-STAT CG4 LACTIC ACID, ED: Lactic Acid, Venous: 2.61 mmol/L (ref 0.5–1.9)

## 2017-06-19 MED ORDER — SODIUM CHLORIDE 0.9 % IV SOLN
1000.0000 mL | INTRAVENOUS | Status: DC
  Administered 2017-06-19: 1000 mL via INTRAVENOUS

## 2017-06-19 MED ORDER — LEVOFLOXACIN IN D5W 750 MG/150ML IV SOLN
750.0000 mg | Freq: Once | INTRAVENOUS | Status: AC
  Administered 2017-06-19: 750 mg via INTRAVENOUS
  Filled 2017-06-19: qty 150

## 2017-06-19 MED ORDER — AZTREONAM 2 G IJ SOLR
2.0000 g | Freq: Once | INTRAMUSCULAR | Status: AC
  Administered 2017-06-19: 2 g via INTRAVENOUS
  Filled 2017-06-19: qty 2

## 2017-06-19 MED ORDER — ACETAMINOPHEN 650 MG RE SUPP
650.0000 mg | Freq: Once | RECTAL | Status: AC
  Administered 2017-06-19: 650 mg via RECTAL
  Filled 2017-06-19: qty 1

## 2017-06-19 MED ORDER — VANCOMYCIN HCL IN DEXTROSE 1-5 GM/200ML-% IV SOLN
1000.0000 mg | Freq: Once | INTRAVENOUS | Status: AC
  Administered 2017-06-20: 1000 mg via INTRAVENOUS
  Filled 2017-06-19: qty 200

## 2017-06-19 NOTE — ED Provider Notes (Signed)
[facility]-EM[facility]Y DEPT Provider Note   CSN: [id number] Arrival date & time: 06/19/17  2153     History   Chief Complaint Chief Complaint  Patient presents with  . Altered Mental Status    HPI Dawn Lee is a 83 y.o. female.  The history is provided by the nursing home, the EMS personnel and the patient. The history is limited by the condition of the patient (Hx dementia).  Altered Mental Status    Pt was seen at 2210. Per EMS and NH report: NH staff states pt has not been acting her normal self today. NH staff also noticed pt was "warm to the touch."  No reported vomiting/diarrhea, no SOB. Pt herself has hx of dementia and does not know why she is in the ED.   Past Medical History:  Diagnosis Date  . Dementia   . DJD (degenerative joint disease)   . GERD (gastroesophageal reflux disease)   . High cholesterol   . Hypertension   . Reflux     Patient Active Problem List   Diagnosis Date Noted  . Leg mass 09/07/2012  . BURSITIS, LEFT SHOULDER 12/17/2010  . LATERAL EPICONDYLITIS 11/13/2010  . SHOULDER PAIN 07/14/2010  . CLOSED FRACTURE OF LOWER END OF RADIUS WITH ULNA 03/13/2010  . OTHER CLOSED FRACTURES OF DISTAL END OF RADIUS 03/11/2010    Past Surgical History:  Procedure Laterality Date  . pins and plate in left wrist    . pins in right hip      OB History    No data available       Home Medications    Prior to Admission medications   Medication Sig Start Date End Date Taking? Authorizing Provider  acetaminophen (TYLENOL) 325 MG tablet Take 650 mg by mouth every 6 (six) hours as needed.   Yes [provider]  acetaminophen (TYLENOL) 500 MG tablet Take 500 mg by mouth 2 (two) times daily.   Yes [provider]  Cranberry 450 MG TABS Take 450 mg by mouth daily.   Yes [provider]  ketotifen (ZADITOR) 0.025 % ophthalmic solution Apply 1 drop to eye daily as needed (for dry eye).   Yes [provider]  Memantine  HCl-Donepezil HCl (NAMZARIC) 14-10 MG CP24 Take 1 capsule by mouth at bedtime.   Yes [provider]  nystatin ointment (MYCOSTATIN) Apply 1 application topically 2 (two) times daily. Vaginally for redness   Yes [provider]  pantoprazole (PROTONIX) 20 MG tablet Take 20 mg by mouth daily.    Yes [provider]    Family History Family History  Problem Relation Age of Onset  . Diabetes Unknown        family history   . Heart defect Unknown        family history   . Arthritis Unknown        family history     Social History Social History  Substance Use Topics  . Smoking status: Never Smoker  . Smokeless tobacco: Never Used  . Alcohol use No     Allergies   Penicillins   Review of Systems Review of Systems  Unable to perform ROS: Dementia     Physical Exam Updated Vital Signs BP (!) 160/97   Pulse (!) 112   Temp (!) 103.5 F (39.7 C) (Rectal)   Resp (!) 24   Ht 5\' 4"  (1.626 m)   Wt 72.6 kg (160 lb)   SpO2 96%   BMI  27.46 kg/m     Patient Vitals for the past 24 hrs:  BP Temp Temp src Pulse Resp SpO2 Height Weight  06/19/17 2335 - (!) 103.5 F (39.7 C) Rectal - - - - -  06/19/17 2300 (!) 160/97 - - (!) 112 (!) 24 96 % - -  06/19/17 2230 - - - - (!) 21 - - -  06/19/17 2201 (!) 161/80 (!) 104.2 F (40.1 C) Rectal (!) 112 (!) 36 98 % - -  06/19/17 2158 - - - - - - 5\' 4"  (1.626 m) 72.6 kg (160 lb)  '  Physical Exam 2215: Physical examination:  Nursing notes reviewed; Vital signs and O2 SAT reviewed;  Constitutional: Well developed, Well nourished, In no acute distress; Head:  Normocephalic, atraumatic; Eyes: EOMI, PERRL, No scleral icterus; ENMT: Mouth and pharynx normal, Mucous membranes dry; Neck: Supple, Full range of motion, No lymphadenopathy; Cardiovascular: Regular rate and rhythm, No gallop; Respiratory: Breath sounds clear & equal bilaterally, No wheezes.  Speaking full sentences with ease, Normal respiratory  effort/excursion; Chest: Nontender, Movement normal; Abdomen: Soft, Nontender, Nondistended, Normal bowel sounds; Genitourinary: No CVA tenderness; Extremities: Pulses normal, No tenderness, No edema, No calf edema or asymmetry.; Neuro: Awake, alert, confused per hx dementia. Major CN grossly intact. No facial droop. Speech clear. Moves all extremities on stretcher spontaneously without apparent gross focal motor deficits.; Skin: Color normal, Warm, Dry.   ED Treatments / Results  Labs (all labs ordered are listed, but only abnormal results are displayed)   EKG  EKG Interpretation  Date/Time:  Saturday June 19 2017 22:16:33 EDT Ventricular Rate:  115 PR Interval:    QRS Duration: 112 QT Interval:  312 QTC Calculation: 438 R Axis:   87 Text Interpretation:  Sinus tachycardia with irregular rate Consider left atrial enlargement Anteroseptal infarct, age indeterminate Baseline wander Artifact When compared with ECG of 04/10/2013 Rate faster and artifact is present Confirmed by [facility]  MD, Nicholos Johns [phone]) on 06/19/2017 10:44:40 PM       Radiology   Procedures Procedures (including critical care time)  Medications Ordered in ED Medications  0.9 %  sodium chloride infusion (1,000 mLs Intravenous New Bag/Given 06/19/17 2219)  levofloxacin (LEVAQUIN) IVPB 750 mg (750 mg Intravenous New Bag/Given 06/19/17 2250)  aztreonam (AZACTAM) 2 g in dextrose 5 % 50 mL IVPB (2 g Intravenous New Bag/Given 06/19/17 2344)  vancomycin (VANCOCIN) IVPB 1000 mg/200 mL premix (not administered)  acetaminophen (TYLENOL) suppository 650 mg (650 mg Rectal Given 06/19/17 2255)     Initial Impression / Assessment and Plan / ED Course  I have reviewed the triage vital signs and the nursing notes.  Pertinent labs & imaging results that were available during my care of the patient were reviewed by me and considered in my medical decision making (see chart for details).  MDM Reviewed: previous chart, nursing note  and vitals Reviewed previous: labs and ECG Interpretation: labs, ECG and x-ray Total time providing critical care: 30-74 minutes. This excludes time spent performing separately reportable procedures and services. Consults: admitting MD    CRITICAL CARE Performed by: Laray Anger Total critical care time: 35 minutes Critical care time was exclusive of separately billable procedures and treating other patients. Critical care was necessary to treat or prevent imminent or life-threatening deterioration. Critical care was time spent personally by me on the following activities: development of treatment plan with patient and/or surrogate as well as nursing, discussions with consultants, evaluation of patient's response to treatment, examination of  patient, obtaining history from patient or surrogate, ordering and performing treatments and interventions, ordering and review of laboratory studies, ordering and review of radiographic studies, pulse oximetry and re-evaluation of patient's condition.   Results for orders placed or performed during the hospital encounter of 06/19/17  Culture, blood (Routine x 2)  Result Value Ref Range   Specimen Description RIGHT ANTECUBITAL    Special Requests Blood Culture adequate volume    Culture PENDING    Report Status PENDING   Culture, blood (Routine x 2)  Result Value Ref Range   Specimen Description BLOOD LEFT HAND    Special Requests      Blood Culture results may not be optimal due to an inadequate volume of blood received in culture bottles   Culture PENDING    Report Status PENDING   Comprehensive metabolic panel  Result Value Ref Range   Sodium 153 (H) 135 - 145 mmol/L   Potassium 3.6 3.5 - 5.1 mmol/L   Chloride 116 (H) 101 - 111 mmol/L   CO2 24 22 - 32 mmol/L   Glucose, Bld 113 (H) 65 - 99 mg/dL   BUN 45 (H) 6 - 20 mg/dL   Creatinine, Ser 1.61 (H) 0.44 - 1.00 mg/dL   Calcium 8.6 (L) 8.9 - 10.3 mg/dL   Total Protein 7.8 6.5 - 8.1  g/dL   Albumin 3.4 (L) 3.5 - 5.0 g/dL   AST 19 15 - 41 U/L   ALT 9 (L) 14 - 54 U/L   Alkaline Phosphatase 87 38 - 126 U/L   Total Bilirubin 0.5 0.3 - 1.2 mg/dL   GFR calc non Af Amer 13 (L) >60 mL/min   GFR calc Af Amer 14 (L) >60 mL/min   Anion gap 13 5 - 15  CBC with Differential  Result Value Ref Range   WBC 15.3 (H) 4.0 - 10.5 K/uL   RBC 5.11 3.87 - 5.11 MIL/uL   Hemoglobin 14.0 12.0 - 15.0 g/dL   HCT 09.6 04.5 - 40.9 %   MCV 86.7 78.0 - 100.0 fL   MCH 27.4 26.0 - 34.0 pg   MCHC 31.6 30.0 - 36.0 g/dL   RDW 81.1 91.4 - 78.2 %   Platelets 226 150 - 400 K/uL   Neutrophils Relative % 96 %   Neutro Abs 14.5 (H) 1.7 - 7.7 K/uL   Lymphocytes Relative 3 %   Lymphs Abs 0.5 (L) 0.7 - 4.0 K/uL   Monocytes Relative 1 %   Monocytes Absolute 0.2 0.1 - 1.0 K/uL   Eosinophils Relative 0 %   Eosinophils Absolute 0.0 0.0 - 0.7 K/uL   Basophils Relative 0 %   Basophils Absolute 0.0 0.0 - 0.1 K/uL  Protime-INR  Result Value Ref Range   Prothrombin Time 14.3 11.4 - 15.2 seconds   INR 1.11   Urinalysis, Routine w reflex microscopic  Result Value Ref Range   Color, Urine AMBER (A) YELLOW   APPearance TURBID (A) CLEAR   Specific Gravity, Urine 1.014 1.005 - 1.030   pH 5.0 5.0 - 8.0   Glucose, UA NEGATIVE NEGATIVE mg/dL   Hgb urine dipstick MODERATE (A) NEGATIVE   Bilirubin Urine NEGATIVE NEGATIVE   Ketones, ur NEGATIVE NEGATIVE mg/dL   Protein, ur 956 (A) NEGATIVE mg/dL   Nitrite NEGATIVE NEGATIVE   Leukocytes, UA MODERATE (A) NEGATIVE   RBC / HPF 6-30 0 - 5 RBC/hpf   WBC, UA TOO NUMEROUS TO COUNT 0 - 5 WBC/hpf   Bacteria, UA  FEW (A) NONE SEEN   Squamous Epithelial / LPF 0-5 (A) NONE SEEN  Troponin I  Result Value Ref Range   Troponin I 0.06 (HH) <0.03 ng/mL  I-Stat CG4 Lactic Acid, ED  Result Value Ref Range   Lactic Acid, Venous 2.61 (HH) 0.5 - 1.9 mmol/L   Comment MD NOTIFIED, SUGGEST RECOLLECT    Dg Chest Portable 1 View Result Date: 06/19/2017 CLINICAL DATA:  Cough EXAM:  PORTABLE CHEST 1 VIEW COMPARISON:  Chest x-ray dated 03/24/2016. FINDINGS: Heart size and mediastinal contours are stable. Coarse interstitial lung markings suggests chronic interstitial lung disease. No new lung findings, although the left lung base is poorly seen due to overlying heart shadow. No pleural effusion or pneumothorax seen. Osseous structures about the chest are unremarkable. IMPRESSION: 1. Left lower lung is obscured by the overlying heart shadow. Given the patient's clinical presentation, would consider a lateral view if possible to exclude left lower lobe pneumonia. 2. No evidence of pneumonia within the remainder of the visualized lungs. 3. Probable chronic interstitial lung disease. 4. Stable cardiomegaly. Electronically Signed   By: Bary Richard M.D.   On: 06/19/2017 22:56    0005:  Code Sepsis called on pt's arrival. APAP given for fever. IV abx started after [facility] and UC obtained. Judicious IVF given for newly elevated Na as well as BUN/Cr.  T/C to [facility]. Conley Rolls, case discussed, including:  HPI, pertinent PM/SHx, VS/PE, dx testing, ED course and treatment:  Agreeable to admit.     Final Clinical Impressions(s) / ED Diagnoses   Final diagnoses:  Sepsis, due to unspecified organism (HCC)  AKI (acute kidney injury) (HCC)  Urinary tract infection without hematuria, site unspecified    New Prescriptions New Prescriptions   No medications on file      Samuel Jester, DO 06/22/17 1610

## 2017-06-19 NOTE — ED Notes (Signed)
Called for family, no answer in waiting room,

## 2017-06-19 NOTE — ED Notes (Signed)
Date and time results received: 06/19/17 11:16 PM   Test:lactic acid Critical Value: 2.6  Name of Provider Notified: Dr Clarene DukeMcManus  Orders Received? Or Actions Taken?: Orders Received - See Orders for details

## 2017-06-19 NOTE — ED Triage Notes (Signed)
Pt is a resident of [facility] who has not been their normal self today and staff noticed that pt was warm to touch as well, pt had also complained of a headache earlier today,

## 2017-06-19 NOTE — ED Notes (Signed)
CRITICAL VALUE ALERT  Critical Value:  Trop 0.06  Date & Time Notied:  06/19/2017 @23 :45  Provider Notified: Dr Clarene DukeMcmanus  Orders Received/Actions taken: no additional orders received,

## 2017-06-19 NOTE — ED Notes (Signed)
Lab at bedside drawing blood cultures x 2

## 2017-06-20 ENCOUNTER — Encounter (HOSPITAL_COMMUNITY): Payer: Self-pay | Admitting: Internal Medicine

## 2017-06-20 DIAGNOSIS — I10 Essential (primary) hypertension: Secondary | ICD-10-CM | POA: Diagnosis not present

## 2017-06-20 DIAGNOSIS — Z66 Do not resuscitate: Secondary | ICD-10-CM | POA: Diagnosis not present

## 2017-06-20 DIAGNOSIS — I248 Other forms of acute ischemic heart disease: Secondary | ICD-10-CM | POA: Diagnosis not present

## 2017-06-20 DIAGNOSIS — E870 Hyperosmolality and hypernatremia: Secondary | ICD-10-CM | POA: Diagnosis not present

## 2017-06-20 DIAGNOSIS — A419 Sepsis, unspecified organism: Secondary | ICD-10-CM | POA: Diagnosis not present

## 2017-06-20 DIAGNOSIS — R7989 Other specified abnormal findings of blood chemistry: Secondary | ICD-10-CM

## 2017-06-20 DIAGNOSIS — N179 Acute kidney failure, unspecified: Secondary | ICD-10-CM | POA: Diagnosis present

## 2017-06-20 DIAGNOSIS — R6520 Severe sepsis without septic shock: Secondary | ICD-10-CM

## 2017-06-20 DIAGNOSIS — E860 Dehydration: Secondary | ICD-10-CM | POA: Diagnosis not present

## 2017-06-20 DIAGNOSIS — R748 Abnormal levels of other serum enzymes: Secondary | ICD-10-CM | POA: Diagnosis not present

## 2017-06-20 DIAGNOSIS — A4189 Other specified sepsis: Secondary | ICD-10-CM | POA: Diagnosis not present

## 2017-06-20 DIAGNOSIS — F0391 Unspecified dementia with behavioral disturbance: Secondary | ICD-10-CM | POA: Diagnosis not present

## 2017-06-20 DIAGNOSIS — K219 Gastro-esophageal reflux disease without esophagitis: Secondary | ICD-10-CM | POA: Diagnosis not present

## 2017-06-20 DIAGNOSIS — N390 Urinary tract infection, site not specified: Secondary | ICD-10-CM | POA: Diagnosis not present

## 2017-06-20 DIAGNOSIS — R4182 Altered mental status, unspecified: Secondary | ICD-10-CM | POA: Diagnosis not present

## 2017-06-20 DIAGNOSIS — R7881 Bacteremia: Secondary | ICD-10-CM | POA: Diagnosis not present

## 2017-06-20 DIAGNOSIS — R778 Other specified abnormalities of plasma proteins: Secondary | ICD-10-CM | POA: Diagnosis present

## 2017-06-20 DIAGNOSIS — J849 Interstitial pulmonary disease, unspecified: Secondary | ICD-10-CM | POA: Diagnosis not present

## 2017-06-20 DIAGNOSIS — E7800 Pure hypercholesterolemia, unspecified: Secondary | ICD-10-CM | POA: Diagnosis not present

## 2017-06-20 DIAGNOSIS — F0390 Unspecified dementia without behavioral disturbance: Secondary | ICD-10-CM | POA: Diagnosis present

## 2017-06-20 DIAGNOSIS — G9341 Metabolic encephalopathy: Secondary | ICD-10-CM | POA: Diagnosis not present

## 2017-06-20 HISTORY — DX: Sepsis, unspecified organism: A41.9

## 2017-06-20 LAB — CBC
HCT: 34.6 % — ABNORMAL LOW (ref 36.0–46.0)
Hemoglobin: 10.6 g/dL — ABNORMAL LOW (ref 12.0–15.0)
MCH: 26.7 pg (ref 26.0–34.0)
MCHC: 30.6 g/dL (ref 30.0–36.0)
MCV: 87.2 fL (ref 78.0–100.0)
PLATELETS: 212 10*3/uL (ref 150–400)
RBC: 3.97 MIL/uL (ref 3.87–5.11)
RDW: 14.4 % (ref 11.5–15.5)
WBC: 22.2 10*3/uL — ABNORMAL HIGH (ref 4.0–10.5)

## 2017-06-20 LAB — COMPREHENSIVE METABOLIC PANEL
ALBUMIN: 2.4 g/dL — AB (ref 3.5–5.0)
ALK PHOS: 58 U/L (ref 38–126)
ALT: 8 U/L — AB (ref 14–54)
AST: 16 U/L (ref 15–41)
Anion gap: 10 (ref 5–15)
BUN: 40 mg/dL — AB (ref 6–20)
CHLORIDE: 115 mmol/L — AB (ref 101–111)
CO2: 22 mmol/L (ref 22–32)
CREATININE: 2.34 mg/dL — AB (ref 0.44–1.00)
Calcium: 7.4 mg/dL — ABNORMAL LOW (ref 8.9–10.3)
GFR calc Af Amer: 20 mL/min — ABNORMAL LOW (ref >60)
GFR calc non Af Amer: 17 mL/min — ABNORMAL LOW (ref >60)
GLUCOSE: 147 mg/dL — AB (ref 65–99)
Potassium: 3.4 mmol/L — ABNORMAL LOW (ref 3.5–5.1)
SODIUM: 147 mmol/L — AB (ref 135–145)
Total Bilirubin: 0.4 mg/dL (ref 0.3–1.2)
Total Protein: 5.8 g/dL — ABNORMAL LOW (ref 6.5–8.1)

## 2017-06-20 LAB — MRSA PCR SCREENING: MRSA by PCR: NEGATIVE

## 2017-06-20 LAB — LACTIC ACID, PLASMA: LACTIC ACID, VENOUS: 1.5 mmol/L (ref 0.5–1.9)

## 2017-06-20 MED ORDER — HEPARIN SODIUM (PORCINE) 5000 UNIT/ML IJ SOLN
5000.0000 [IU] | Freq: Three times a day (TID) | INTRAMUSCULAR | Status: DC
  Administered 2017-06-20 – 2017-06-24 (×14): 5000 [IU] via SUBCUTANEOUS
  Filled 2017-06-20 (×13): qty 1

## 2017-06-20 MED ORDER — POTASSIUM CHLORIDE IN NACL 20-0.9 MEQ/L-% IV SOLN
INTRAVENOUS | Status: DC
  Administered 2017-06-20 – 2017-06-21 (×2): via INTRAVENOUS

## 2017-06-20 MED ORDER — DONEPEZIL HCL 5 MG PO TABS
10.0000 mg | ORAL_TABLET | Freq: Every day | ORAL | Status: DC
  Administered 2017-06-20 – 2017-06-23 (×4): 10 mg via ORAL
  Filled 2017-06-20 (×4): qty 2

## 2017-06-20 MED ORDER — VANCOMYCIN HCL IN DEXTROSE 1-5 GM/200ML-% IV SOLN
1000.0000 mg | INTRAVENOUS | Status: DC
  Administered 2017-06-21: 1000 mg via INTRAVENOUS
  Filled 2017-06-20: qty 200

## 2017-06-20 MED ORDER — ACETAMINOPHEN 325 MG PO TABS: 650.0000 mg | ORAL_TABLET | ORAL | Status: DC | PRN

## 2017-06-20 MED ORDER — SODIUM CHLORIDE 0.9% FLUSH
3.0000 mL | Freq: Two times a day (BID) | INTRAVENOUS | Status: DC
  Administered 2017-06-20 – 2017-06-24 (×5): 3 mL via INTRAVENOUS

## 2017-06-20 MED ORDER — SODIUM CHLORIDE 0.9 % IV BOLUS (SEPSIS)
500.0000 mL | Freq: Once | INTRAVENOUS | Status: AC
  Administered 2017-06-20: 500 mL via INTRAVENOUS

## 2017-06-20 MED ORDER — DEXTROSE-NACL 5-0.9 % IV SOLN
INTRAVENOUS | Status: DC
  Administered 2017-06-20: 02:00:00 via INTRAVENOUS

## 2017-06-20 MED ORDER — KETOTIFEN FUMARATE 0.025 % OP SOLN
1.0000 [drp] | Freq: Two times a day (BID) | OPHTHALMIC | Status: DC
  Administered 2017-06-20 – 2017-06-24 (×9): 1 [drp] via OPHTHALMIC
  Filled 2017-06-20: qty 5

## 2017-06-20 MED ORDER — MEMANTINE HCL ER 7 MG PO CP24
14.0000 mg | ORAL_CAPSULE | Freq: Every day | ORAL | Status: DC
  Administered 2017-06-20 – 2017-06-23 (×4): 14 mg via ORAL
  Filled 2017-06-20 (×2): qty 2
  Filled 2017-06-20: qty 1
  Filled 2017-06-20 (×2): qty 2

## 2017-06-20 MED ORDER — PANTOPRAZOLE SODIUM 20 MG PO TBEC
20.0000 mg | DELAYED_RELEASE_TABLET | Freq: Every day | ORAL | Status: DC
  Filled 2017-06-20 (×2): qty 1

## 2017-06-20 MED ORDER — DEXTROSE 5 % IV SOLN
1.0000 g | Freq: Three times a day (TID) | INTRAVENOUS | Status: DC
  Administered 2017-06-20 – 2017-06-23 (×11): 1 g via INTRAVENOUS
  Filled 2017-06-20 (×14): qty 1

## 2017-06-20 MED ORDER — MEMANTINE HCL-DONEPEZIL HCL ER 14-10 MG PO CP24: 1.0000 | ORAL_CAPSULE | Freq: Every day | ORAL | Status: DC

## 2017-06-20 MED ORDER — PANTOPRAZOLE SODIUM 40 MG PO TBEC
40.0000 mg | DELAYED_RELEASE_TABLET | Freq: Every day | ORAL | Status: DC
  Administered 2017-06-20 – 2017-06-24 (×5): 40 mg via ORAL
  Filled 2017-06-20 (×5): qty 1

## 2017-06-20 NOTE — H&P (Signed)
History and Physical    Dawn Lee ZOX:[id number] DOB: 1923-05-15 DOA: 06/19/2017  PCP: [name], MD  Patient coming from: SNF.    Chief Complaint:  Sent in from SNF with Temp of 104.   HPI: Dawn Lee is an 83 y.o. female with hx of advanced dementia, DNR code status, SNF resident, hx of DJD, sent from SNF for temp of 104.  She has dementia and unable to give any history or complaints.  Work up in the ER confirmed Temp of 104, with tachycardia, but no hypotension.  She was cultured, and serology showed WBC of 15K, Na of 153, BUN 45 and Cr of 3.0.  She was started on IV Van/ Aztreonam, and given some iVF.  Her lactic acid was slightly elevated above 2, and hospitalist was asked to admit her for severe sepsis.  Her CXR queried PNA, but she did not have any pulmonary symptoms.  Her UA showed UTI, and her urine is very cloudy.  Suspect she has sepsis due to UTI.     ED Course:  See above.  Rewiew of Systems:  Unable.    Past Medical History:  Diagnosis Date  . Dementia   . DJD (degenerative joint disease)   . GERD (gastroesophageal reflux disease)   . High cholesterol   . Hypertension   . Reflux     Past Surgical History:  Procedure Laterality Date  . pins and plate in left wrist    . pins in right hip       reports that she has never smoked. She has never used smokeless tobacco. She reports that she does not drink alcohol or use drugs.  Allergies  Allergen Reactions  . Penicillins     Has patient had a PCN reaction causing immediate rash, facial/tongue/throat swelling, SOB or lightheadedness with hypotension: Unknown Has patient had a PCN reaction causing severe rash involving mucus membranes or skin necrosis: Unknown Has patient had a PCN reaction that required hospitalization: Unknown Has patient had a PCN reaction occurring within the last 10 years: Unknown If all of the above answers are "NO", then may proceed with Cephalosporin use.     Family  History  Problem Relation Age of Onset  . Diabetes Unknown        family history   . Heart defect Unknown        family history   . Arthritis Unknown        family history      Prior to Admission medications   Medication Sig Start Date End Date Taking? Authorizing Provider  acetaminophen (TYLENOL) 325 MG tablet Take 650 mg by mouth every 6 (six) hours as needed.   Yes [provider]  acetaminophen (TYLENOL) 500 MG tablet Take 500 mg by mouth 2 (two) times daily.   Yes [provider]  Cranberry 450 MG TABS Take 450 mg by mouth daily.   Yes [provider]  ketotifen (ZADITOR) 0.025 % ophthalmic solution Apply 1 drop to eye daily as needed (for dry eye).   Yes [provider]  Memantine HCl-Donepezil HCl (NAMZARIC) 14-10 MG CP24 Take 1 capsule by mouth at bedtime.   Yes [provider]  nystatin ointment (MYCOSTATIN) Apply 1 application topically 2 (two) times daily. Vaginally for redness   Yes [provider]  pantoprazole (PROTONIX) 20 MG tablet Take 20 mg by mouth daily.    Yes [provider]    Physical Exam: Vitals:  06/19/17 2230 06/19/17 2300 06/19/17 2335 06/20/17 0010  BP:  (!) 160/97  (!) 103/44  Pulse:  (!) 112  (!) 109  Resp: (!) 21 (!) 24  (!) 27  Temp:   (!) 103.5 F (39.7 C)   TempSrc:   Rectal   SpO2:  96%  96%  Weight:      Height:          Constitutional: NAD, calm, comfortable Vitals:   06/19/17 2230 06/19/17 2300 06/19/17 2335 06/20/17 0010  BP:  (!) 160/97  (!) 103/44  Pulse:  (!) 112  (!) 109  Resp: (!) 21 (!) 24  (!) 27  Temp:   (!) 103.5 F (39.7 C)   TempSrc:   Rectal   SpO2:  96%  96%  Weight:      Height:       Eyes: PERRL, lids and conjunctivae normal ENMT: Mucous membranes are moist. Posterior pharynx clear of any exudate or lesions.Normal dentition.  Neck: normal, supple, no masses, no thyromegaly Respiratory: clear to auscultation bilaterally, no wheezing, no crackles.  Normal respiratory effort. No accessory muscle use.  Cardiovascular: Regular rate and rhythm, no murmurs / rubs / gallops. No extremity edema. 2+ pedal pulses. No carotid bruits.  Abdomen: no tenderness, no masses palpated. No hepatosplenomegaly. Bowel sounds positive.  Musculoskeletal: no clubbing / cyanosis. No joint deformity upper and lower extremities. Good ROM, no contractures. Normal muscle tone.  Skin: no rashes, lesions, ulcers. No induration Neurologic: CN 2-12 grossly intact. Sensation intact, DTR normal. Strength 5/5 in all 4.  Psychiatric: Normal judgment and insight. Alert and oriented x 3. Normal mood.   Labs on Admission: I have personally reviewed following labs and imaging studies  CBC:  Recent Labs Lab 06/19/17 2238  WBC 15.3*  NEUTROABS 14.5*  HGB 14.0  HCT 44.3  MCV 86.7  PLT 226   Basic Metabolic Panel:  Recent Labs Lab 06/19/17 2238  NA 153*  K 3.6  CL 116*  CO2 24  GLUCOSE 113*  BUN 45*  CREATININE 3.01*  CALCIUM 8.6*   GFR: Estimated Creatinine Clearance: 11.7 mL/min (A) (by C-G formula based on SCr of 3.01 mg/dL (H)). Liver Function Tests:  Recent Labs Lab 06/19/17 2238  AST 19  ALT 9*  ALKPHOS 87  BILITOT 0.5  PROT 7.8  ALBUMIN 3.4*   Coagulation Profile:  Recent Labs Lab 06/19/17 2238  INR 1.11   Cardiac Enzymes:  Recent Labs Lab 06/19/17 2238  TROPONINI 0.06*   Urine analysis:    Component Value Date/Time   COLORURINE AMBER (A) 06/19/2017 2208   APPEARANCEUR TURBID (A) 06/19/2017 2208   LABSPEC 1.014 06/19/2017 2208   PHURINE 5.0 06/19/2017 2208   GLUCOSEU NEGATIVE 06/19/2017 2208   HGBUR MODERATE (A) 06/19/2017 2208   BILIRUBINUR NEGATIVE 06/19/2017 2208   KETONESUR NEGATIVE 06/19/2017 2208   PROTEINUR 100 (A) 06/19/2017 2208   UROBILINOGEN 0.2 04/10/2013 1230   NITRITE NEGATIVE 06/19/2017 2208   LEUKOCYTESUR MODERATE (A) 06/19/2017 2208    Recent Results (from the past 240 hour(s))  Culture, blood  (Routine x 2)     Status: None (Preliminary result)   Collection Time: 06/19/17 10:20 PM  Result Value Ref Range Status   Specimen Description BLOOD LEFT HAND  Final   Special Requests   Final    Blood Culture results may not be optimal due to an inadequate volume of blood received in culture bottles   Culture PENDING  Incomplete   Report Status  PENDING  Incomplete  Culture, blood (Routine x 2)     Status: None (Preliminary result)   Collection Time: 06/19/17 10:50 PM  Result Value Ref Range Status   Specimen Description RIGHT ANTECUBITAL  Final   Special Requests Blood Culture adequate volume  Final   Culture PENDING  Incomplete   Report Status PENDING  Incomplete     Radiological Exams on Admission: Dg Chest Portable 1 View  Result Date: 06/19/2017 CLINICAL DATA:  Cough EXAM: PORTABLE CHEST 1 VIEW COMPARISON:  Chest x-ray dated 03/24/2016. FINDINGS: Heart size and mediastinal contours are stable. Coarse interstitial lung markings suggests chronic interstitial lung disease. No new lung findings, although the left lung base is poorly seen due to overlying heart shadow. No pleural effusion or pneumothorax seen. Osseous structures about the chest are unremarkable. IMPRESSION: 1. Left lower lung is obscured by the overlying heart shadow. Given the patient's clinical presentation, would consider a lateral view if possible to exclude left lower lobe pneumonia. 2. No evidence of pneumonia within the remainder of the visualized lungs. 3. Probable chronic interstitial lung disease. 4. Stable cardiomegaly. Electronically Signed   By: Bary Richard M.D.   On: 06/19/2017 22:56    EKG: Independently reviewed.   Assessment/Plan Principal Problem:   Severe sepsis (HCC) Active Problems:   Dementia   DNR no code (do not resuscitate)   Sepsis (HCC)   PLAN:   Sepsis due to UTI:  Will continue judicious IVF.  She is not hypotensive, and her lactic acid elevation was modest.  Will continue with IV  Van/Aztrenam.    Dementia:  Will continue with her Namenda and Aricept combination pill.   DNR:  I spoke with her daughter Para March on the phone and confirmed this code status.    DVT prophylaxis: subQ Heparin Code Status: DNR.  Family Communication: daughter.  Disposition Plan: SNF>  Consults called: None.  Admission status: inpatient.    Dearis Danis MD FACP. [facility]  If 7PM-7AM, please contact night-coverage [email or web address] Password [facility]  06/20/2017, 12:31 AM

## 2017-06-20 NOTE — Progress Notes (Signed)
Dr. Conley RollsLe notified of SpO2 continuously 91-92% on RA. Respirations 24, but no apparent distress noted. No need for O2 at this time.

## 2017-06-20 NOTE — Progress Notes (Signed)
Pharmacy Antibiotic Note  Dawn Lee is a 83 y.o. female admitted on 06/19/2017 with sepsis.  Pharmacy has been consulted for vancomycin and aztreonam dosing. Vancomycin 1 gm IV and aztreonam 2gm given in ED  Plan: Cont aztreonam 1 gm IV q8 hours Cont vanc 1gm IV q48 hours F/u renal function, cultures and clinical course  Height: 5\' 4"  (162.6 cm) Weight: 149 lb 0.5 oz (67.6 kg) IBW/kg (Calculated) : 54.7  Temp (24hrs), Avg:101.7 F (38.7 C), Min:98.4 F (36.9 C), Max:104.2 F (40.1 C)   Recent Labs Lab 06/19/17 2238 06/19/17 2303 06/20/17 0443  WBC 15.3*  --  22.2*  CREATININE 3.01*  --  2.34*  LATICACIDVEN  --  2.61* 1.5    Estimated Creatinine Clearance: 14.5 mL/min (A) (by C-G formula based on SCr of 2.34 mg/dL (H)).    Allergies  Allergen Reactions  . Penicillins     Has patient had a PCN reaction causing immediate rash, facial/tongue/throat swelling, SOB or lightheadedness with hypotension: Unknown Has patient had a PCN reaction causing severe rash involving mucus membranes or skin necrosis: Unknown Has patient had a PCN reaction that required hospitalization: Unknown Has patient had a PCN reaction occurring within the last 10 years: Unknown If all of the above answers are "NO", then may proceed with Cephalosporin use.     Thank you for allowing pharmacy to be a part of this patient's care.  Dawn CageSeay, Manar Smalling Lee 06/20/2017 10:06 AM

## 2017-06-20 NOTE — Progress Notes (Signed)
Patient is a 83 year old SNF resident with a history of DJD, advanced dementia, GERD, who was admitted this morning by Dr. Conley RollsLe for evaluation and management of a fever of 104. Evaluation in the ED yielded a temperature of 104.2, mild tachycardia, normotensive, lactic acid of 2.6, sodium of 153, BUN of 45, creatinine of 3.0, WBC of 15.3, chest x-ray with chronic interstitial lung disease, and a UA with too numerous to count WBCs and few bacteria. She was started on vancomycin and aztreonam.  Agree with current management with additions below.  -We'll add potassium to the IV fluids as her serum K+ fell to 3.4. -Her serum sodium and renal function are improving with IV fluids, so will continue. -Lactic acid has normalized from 2.61>> 1.5. -WBC has increased to 22.2, possibly from demargination; she remains afebrile.

## 2017-06-21 DIAGNOSIS — R7881 Bacteremia: Secondary | ICD-10-CM

## 2017-06-21 DIAGNOSIS — E870 Hyperosmolality and hypernatremia: Secondary | ICD-10-CM

## 2017-06-21 DIAGNOSIS — R748 Abnormal levels of other serum enzymes: Secondary | ICD-10-CM

## 2017-06-21 DIAGNOSIS — A419 Sepsis, unspecified organism: Secondary | ICD-10-CM

## 2017-06-21 DIAGNOSIS — N390 Urinary tract infection, site not specified: Secondary | ICD-10-CM

## 2017-06-21 DIAGNOSIS — F0391 Unspecified dementia with behavioral disturbance: Secondary | ICD-10-CM

## 2017-06-21 LAB — CBC
HCT: 36.6 % (ref 36.0–46.0)
HEMOGLOBIN: 11.1 g/dL — AB (ref 12.0–15.0)
MCH: 26.6 pg (ref 26.0–34.0)
MCHC: 30.3 g/dL (ref 30.0–36.0)
MCV: 87.6 fL (ref 78.0–100.0)
Platelets: 203 10*3/uL (ref 150–400)
RBC: 4.18 MIL/uL (ref 3.87–5.11)
RDW: 14.9 % (ref 11.5–15.5)
WBC: 15 10*3/uL — ABNORMAL HIGH (ref 4.0–10.5)

## 2017-06-21 LAB — BASIC METABOLIC PANEL
Anion gap: 6 (ref 5–15)
BUN: 27 mg/dL — AB (ref 6–20)
CHLORIDE: 117 mmol/L — AB (ref 101–111)
CO2: 25 mmol/L (ref 22–32)
CREATININE: 1.36 mg/dL — AB (ref 0.44–1.00)
Calcium: 7.8 mg/dL — ABNORMAL LOW (ref 8.9–10.3)
GFR calc Af Amer: 38 mL/min — ABNORMAL LOW (ref >60)
GFR calc non Af Amer: 33 mL/min — ABNORMAL LOW (ref >60)
Glucose, Bld: 88 mg/dL (ref 65–99)
Potassium: 3.8 mmol/L (ref 3.5–5.1)
SODIUM: 148 mmol/L — AB (ref 135–145)

## 2017-06-21 LAB — BLOOD CULTURE ID PANEL (REFLEXED)
Acinetobacter baumannii: NOT DETECTED
CANDIDA PARAPSILOSIS: NOT DETECTED
CANDIDA TROPICALIS: NOT DETECTED
Candida albicans: NOT DETECTED
Candida glabrata: NOT DETECTED
Candida krusei: NOT DETECTED
ENTEROCOCCUS SPECIES: NOT DETECTED
Enterobacter cloacae complex: NOT DETECTED
Enterobacteriaceae species: NOT DETECTED
Escherichia coli: NOT DETECTED
HAEMOPHILUS INFLUENZAE: NOT DETECTED
KLEBSIELLA OXYTOCA: NOT DETECTED
KLEBSIELLA PNEUMONIAE: NOT DETECTED
LISTERIA MONOCYTOGENES: NOT DETECTED
Neisseria meningitidis: NOT DETECTED
PROTEUS SPECIES: NOT DETECTED
Pseudomonas aeruginosa: NOT DETECTED
SERRATIA MARCESCENS: NOT DETECTED
STAPHYLOCOCCUS AUREUS BCID: NOT DETECTED
STAPHYLOCOCCUS SPECIES: NOT DETECTED
STREPTOCOCCUS SPECIES: NOT DETECTED
Streptococcus agalactiae: NOT DETECTED
Streptococcus pneumoniae: NOT DETECTED
Streptococcus pyogenes: NOT DETECTED

## 2017-06-21 MED ORDER — POTASSIUM CHLORIDE IN NACL 20-0.45 MEQ/L-% IV SOLN
INTRAVENOUS | Status: DC
Start: 2017-06-21 — End: 2017-06-23
  Administered 2017-06-21 – 2017-06-23 (×3): via INTRAVENOUS
  Filled 2017-06-21 (×6): qty 1000

## 2017-06-21 NOTE — Clinical Social Work Note (Signed)
Clinical Social Work Assessment  Patient Details  Name: Dawn Lee MRN: [id number] Date of Birth: 05/14/1923  Date of referral:  06/21/17               Reason for consult:  Discharge Planning                Permission sought to share information with:  Case Manager, Facility Medical sales representativeContact Representative, Family Supports Permission granted to share information::  Yes, Verbal Permission Granted  Name::        Agency::  [facility]  Relationship::  Daughter/ facility Family Dollar StoresMichelle  Contact Information:     Housing/Transportation Living arrangements for the past 2 months:  Assisted Press [facility] Facility (memory care) Source of Information:  Medical Team, Case Manager, Facility, Adult Children Patient Interpreter Needed:  None Criminal Activity/Legal Involvement Pertinent to Current Situation/Hospitalization:  No - Comment as needed Significant Relationships:  Adult Children, Merchandiser, retailCommunity Support, Other Family Members Lives with:  Facility Resident Do you feel safe going back to the place where you live?  Yes Need for family participation in patient care:  Yes (Comment)  Care giving concerns:  [facility].  Per  Marcelino DusterMichelle at facility, patient is a long term resident and independent with her ADLs, but did have some assistance.  Reports she was discharged from PT a couple months ago ([facility]) and if needed again [facility].  Facility is expecting patient to return to ALF, stating no higher level is needed unless she nights long term IV antibiotics in which they cannot complete at ALF.  Patient daughter involved as well per facility, not at bedside at this time.  Call placed to daughter via phone to discuss plan of care and any needs.  Daughter reports she wants her mother to return to [name] and very happy with care. Reports she has to keep a tight eye on patient's money as there is very little left  over after she pays for housing and does not qualify for medicaid as she has investments and retirement.  Daughter is actively working to sell patient's house and spend down more money for assistance in care for patient.  Reports she feels PT would not be of help and be a burden of finances and would rather not have HH if not indicated or needed.     Social Worker assessment / plan:  Plan is for patient to return at to [facility] at discharge unless needing long term antibiotics in which was explained to daughter. Will continue to follow and assist with discharge planning in acute care setting. Will updated FL2 at discharge to reflect medications adjustments and care.  Employment status:  Retired Database administratornsurance information:  Managed Medicare PT Recommendations:  Not assessed at this time Information / Referral to community resources:     Patient/Family's Response to care:  understanding  Patient/Family's Understanding of and Emotional Response to Diagnosis, Current Treatment, and Prognosis:  Daughter very involved in care and needs for patient.  Aware of her current treatment and dx and asked appropriate questions.  Emotional Assessment Appearance:  Appears stated age Attitude/Demeanor/Rapport:    Affect (typically observed):  Pleasant Orientation:  Oriented to Self Alcohol / Substance use:  Not Applicable Psych involvement (Current and /or in the community):  No (Comment)  Discharge Needs  Concerns to be addressed:  No discharge needs identified Readmission within the last 30 days:  No Current discharge risk:  None  Barriers to Discharge:  No Barriers Identified, Continued Medical Work up   Cordella Register 06/21/2017, 2:43 PM

## 2017-06-21 NOTE — Progress Notes (Signed)
CRITICAL VALUE ALERT  Critical Value:  Blood culture +  Date & Time Notied:  06/21/17 0715  Provider Notified: Dr. Arbutus Leasat  Orders Received/Actions taken: No response. Shon HaleMary Beth, RN notified

## 2017-06-21 NOTE — Progress Notes (Addendum)
PROGRESS NOTE  Dawn Lee ZOX:[id number] DOB: Feb 11, 1923 DOA: 06/19/2017 PCP: [name], MD  Brief History:  83 year old female with a history of hypertension, dementia, and GERD presented from SNF with altered mental status. Unfortunately, the patient is unable to provide any history secondary to dementia. According to the SNF staff, the patient was "not acting like herself", and the patient was "warm to touch". There are no reports of vomiting, diarrhea, respiratory distress. In the emergency department, the patient was found to have WBC 15.3 and fever 104.55F rectally with lactic acid 2.61. The patient was started on IV fluids, vancomycin and aztreonam.   Assessment/Plan: Sepsis  -present at the time of admission -Secondary to UTI and bacteremia  -Continue vancomycin and Questran and pending culture data  -Lactic acid 2.61 >>> 1.5  -Chest x-ray --chronic interstitial lung disease without infiltrates  -continue IVF  Bacteremia -GPC in 2 of 2 sets -likely urinary source -continue vancomycin pending culture data  UTI -UA with TNTC WBC -continue aztreonam pending cultures data  AKI -baseline creatinine 0.9-1.0 -presenting creatining 3.01 -am BMP  Dementia without behavioral disturbance -continue namenda and aricept   Elevated troponin -due demand ischemia -personally reviewed EKG--sinus with nonspecific ST changes -repeat troponin  Hypernatremia -start hypotonic saline       Disposition Plan:   SNF in 2-3 days  Family Communication: Daughter updated at bedside 6/25  Consultants:  none  Code Status:  FULL  DVT Prophylaxis:  [place] Heparin    Procedures: As Listed in Progress Note Above  Antibiotics: vanco 6/24>>> Aztreonam 6/23>>> Levofloxacin 6/23 x 1   Subjective:  patient complains of pain all over; denies any nausea, vomiting, shortness breath, coughing, diarrhea.  Objective: Vitals:   06/20/17 1125 06/20/17 1300 06/20/17 2150  06/21/17 0500  BP:  (!) 109/57 (!) 142/64 (!) 136/51  Pulse: 80 86 78   Resp:  20 (!) 28 (!) 22  Temp:  98 F (36.7 C) 99.2 F (37.3 C) 98 F (36.7 C)  TempSrc:  Oral Axillary Axillary  SpO2:  98% 97% 98%  Weight:      Height:        Intake/Output Summary (Last 24 hours) at 06/21/17 1313 Last data filed at 06/21/17 1305  Gross per 24 hour  Intake             1493 ml  Output             2150 ml  Net             -657 ml   Weight change:  Exam:   General:  Pt is alert, follows commands appropriately, not in acute distress  HEENT: No icterus, No thrush, No neck mass, [place]/AT  Cardiovascular: RRR, S1/S2, no rubs, no gallops  Respiratory: Bibasilar crackles. No wheeze. Good air movement   Abdomen: Soft/+BS, non tender, non distended, no guarding  Extremities: No edema, No lymphangitis, No petechiae, No rashes, no synovitis   Data Reviewed: I have personally reviewed following labs and imaging studies Basic Metabolic Panel:  Recent Labs Lab 06/19/17 2238 06/20/17 0443 06/21/17 0642  NA 153* 147* 148*  K 3.6 3.4* 3.8  CL 116* 115* 117*  CO2 24 22 25   GLUCOSE 113* 147* 88  BUN 45* 40* 27*  CREATININE 3.01* 2.34* 1.36*  CALCIUM 8.6* 7.4* 7.8*   Liver Function Tests:  Recent Labs Lab 06/19/17 2238 06/20/17 0443  AST 19 16  ALT 9* 8*  ALKPHOS 87 58  BILITOT 0.5 0.4  PROT 7.8 5.8*  ALBUMIN 3.4* 2.4*   No results for input(s): LIPASE, AMYLASE in the last 168 hours. No results for input(s): AMMONIA in the last 168 hours. Coagulation Profile:  Recent Labs Lab 06/19/17 2238  INR 1.11   CBC:  Recent Labs Lab 06/19/17 2238 06/20/17 0443 06/21/17 0642  WBC 15.3* 22.2* 15.0*  NEUTROABS 14.5*  --   --   HGB 14.0 10.6* 11.1*  HCT 44.3 34.6* 36.6  MCV 86.7 87.2 87.6  PLT 226 212 203   Cardiac Enzymes:  Recent Labs Lab 06/19/17 2238  TROPONINI 0.06*   BNP: Invalid input(s): POCBNP CBG: No results for input(s): GLUCAP in the last 168  hours. HbA1C: No results for input(s): HGBA1C in the last 72 hours. Urine analysis:    Component Value Date/Time   COLORURINE AMBER (A) 06/19/2017 2208   APPEARANCEUR TURBID (A) 06/19/2017 2208   LABSPEC 1.014 06/19/2017 2208   PHURINE 5.0 06/19/2017 2208   GLUCOSEU NEGATIVE 06/19/2017 2208   HGBUR MODERATE (A) 06/19/2017 2208   BILIRUBINUR NEGATIVE 06/19/2017 2208   KETONESUR NEGATIVE 06/19/2017 2208   PROTEINUR 100 (A) 06/19/2017 2208   UROBILINOGEN 0.2 04/10/2013 1230   NITRITE NEGATIVE 06/19/2017 2208   LEUKOCYTESUR MODERATE (A) 06/19/2017 2208   Sepsis Labs: @LABRCNTIP (procalcitonin:4,lacticidven:4) ) Recent Results (from the past 240 hour(s))  Culture, blood (Routine x 2)     Status: None (Preliminary result)   Collection Time: 06/19/17 10:20 PM  Result Value Ref Range Status   Specimen Description BLOOD LEFT HAND  Final   Special Requests   Final    Blood Culture results may not be optimal due to an inadequate volume of blood received in culture bottles   Culture  Setup Time   Final    GRAM POSITIVE COCCI Gram Stain Report Called to,Read Back By and Verified With: HAWKINS,M @ 1157 ON 6.25.18 BY BAYSE,L    Culture PENDING  Incomplete   Report Status PENDING  Incomplete  Culture, blood (Routine x 2)     Status: None (Preliminary result)   Collection Time: 06/19/17 10:50 PM  Result Value Ref Range Status   Specimen Description RIGHT ANTECUBITAL  Final   Special Requests Blood Culture adequate volume  Final   Culture  Setup Time   Final    GRAM POSITIVE COCCI ANAEROBIC BOTTLE Gram Stain Report Called to,Read Back By and Verified With: LIGHTNER,L@659  BY MATTHEWS,B [id number] [facility]     Culture NO GROWTH 2 DAYS  Final   Report Status PENDING  Incomplete  MRSA PCR Screening     Status: None   Collection Time: 06/20/17  2:12 AM  Result Value Ref Range Status   MRSA by PCR NEGATIVE NEGATIVE Final    Comment:        The GeneXpert MRSA Assay (FDA approved  for NASAL specimens only), is one component of a comprehensive MRSA colonization surveillance program. It is not intended to diagnose MRSA infection nor to guide or monitor treatment for MRSA infections.      Scheduled Meds: . memantine  14 mg Oral QHS   And  . donepezil  10 mg Oral QHS  . heparin  5,000 Units Subcutaneous Q8H  . ketotifen  1 drop Both Eyes BID  . pantoprazole  40 mg Oral Daily  . sodium chloride flush  3 mL Intravenous Q12H   Continuous Infusions: . 0.9 % NaCl with KCl 20 mEq /  L 70 mL/hr at 06/21/17 0335  . aztreonam Stopped (06/21/17 [id number])  . vancomycin      Procedures/Studies: Dg Chest Portable 1 View  Result Date: 06/19/2017 CLINICAL DATA:  Cough EXAM: PORTABLE CHEST 1 VIEW COMPARISON:  Chest x-ray dated 03/24/2016. FINDINGS: Heart size and mediastinal contours are stable. Coarse interstitial lung markings suggests chronic interstitial lung disease. No new lung findings, although the left lung base is poorly seen due to overlying heart shadow. No pleural effusion or pneumothorax seen. Osseous structures about the chest are unremarkable. IMPRESSION: 1. Left lower lung is obscured by the overlying heart shadow. Given the patient's clinical presentation, would consider a lateral view if possible to exclude left lower lobe pneumonia. 2. No evidence of pneumonia within the remainder of the visualized lungs. 3. Probable chronic interstitial lung disease. 4. Stable cardiomegaly. Electronically Signed   By: Bary RichardStan  Maynard M.D.   On: 06/19/2017 22:56    Ozil Stettler, DO  [facility] Pager [phone]  If 7PM-7AM, please contact night-coverage [email or web address] Password [email or web address] 06/21/2017, 1:13 PM   LOS: 1 day

## 2017-06-22 LAB — MAGNESIUM: Magnesium: 1.4 mg/dL — ABNORMAL LOW (ref 1.7–2.4)

## 2017-06-22 LAB — BASIC METABOLIC PANEL
ANION GAP: 9 (ref 5–15)
BUN: 19 mg/dL (ref 6–20)
CALCIUM: 8.1 mg/dL — AB (ref 8.9–10.3)
CO2: 25 mmol/L (ref 22–32)
Chloride: 107 mmol/L (ref 101–111)
Creatinine, Ser: 1.12 mg/dL — ABNORMAL HIGH (ref 0.44–1.00)
GFR calc Af Amer: 48 mL/min — ABNORMAL LOW (ref >60)
GFR calc non Af Amer: 41 mL/min — ABNORMAL LOW (ref >60)
GLUCOSE: 92 mg/dL (ref 65–99)
Potassium: 3.6 mmol/L (ref 3.5–5.1)
Sodium: 141 mmol/L (ref 135–145)

## 2017-06-22 LAB — CBC
HCT: 37.6 % (ref 36.0–46.0)
Hemoglobin: 11.4 g/dL — ABNORMAL LOW (ref 12.0–15.0)
MCH: 26.2 pg (ref 26.0–34.0)
MCHC: 30.3 g/dL (ref 30.0–36.0)
MCV: 86.4 fL (ref 78.0–100.0)
PLATELETS: 239 10*3/uL (ref 150–400)
RBC: 4.35 MIL/uL (ref 3.87–5.11)
RDW: 14.6 % (ref 11.5–15.5)
WBC: 10.9 10*3/uL — AB (ref 4.0–10.5)

## 2017-06-22 MED ORDER — ENSURE ENLIVE PO LIQD
237.0000 mL | Freq: Two times a day (BID) | ORAL | Status: DC
  Administered 2017-06-22 – 2017-06-23 (×3): 237 mL via ORAL

## 2017-06-22 NOTE — Care Management Note (Signed)
Case Management Note  Patient Details  Name: Charlynne Cousinslizabeth C Earhart MRN: [id number] Date of Birth: 01/16/1923  Subjective/Objective: Adm with sepsis/UTI. [facility] from [facility]. Plans to return at time of discharge. [facility], however does not want PT if not absolutely necessary.                     Action/Plan: CM following for needs.    Expected Discharge Date:    06/23/2017              Expected Discharge Plan:  Assisted Living / Rest Home  In-House Referral:  Clinical Social Work  Discharge planning Services  CM Consult  Post Acute Care Choice:    Choice offered to:     DME Arranged:    DME Agency:     HH Arranged:    HH Agency:     Status of Service:  In process, will continue to follow  If discussed at Long Length of Stay Meetings, dates discussed:    Additional Comments:  Hadassah Rana, Chrystine OilerSharley Diane, RN 06/22/2017, 12:00 PM

## 2017-06-22 NOTE — Progress Notes (Signed)
PROGRESS NOTE  Dawn Lee UJW:[id number] DOB: February 08, 1923 DOA: 06/19/2017 PCP: [name], MD  Brief History:  83 year old female with a history of hypertension, dementia, and GERD presented from SNF with altered mental status. Unfortunately, the patient is unable to provide any history secondary to dementia. According to the SNF staff, the patient was "not acting like herself", and the patient was "warm to touch".  According to the patient's daughter, the patient has been more somnolent beginning on 06/17/2017. There are no reports of vomiting, diarrhea, respiratory distress. In the emergency department, the patient was found to have WBC 15.3 and fever 104.36F rectally with lactic acid 2.61. The patient was started on IV fluids, vancomycin and aztreonam.   Assessment/Plan: Sepsis  -present at the time of admission -Secondary to UTI and bacteremia  -Continue vancomycin and Questran and pending culture data  -Lactic acid 2.61 >>> 1.5  -Chest x-ray --chronic interstitial lung disease without infiltrates  -continue IVF  Bacteremia -GPC in 2 of 2 sets -likely urinary source -continue vancomycin pending culture data -06/22/17--repeat blood cultures -echo  UTI -UA with TNTC WBC -continue aztreonam pending cultures data  AKI -baseline creatinine 0.9-1.0 -presenting creatining 3.01 -improved with IVF -am BMP  Acute metabolic encephalopathy -Secondary to sepsis and AKI -Improving with antibiotics and fluids -6/26--daughter states pt is close to baseline  Dementia without behavioral disturbance -continue namenda and aricept   Elevated troponin -due demand ischemia -personally reviewed EKG--sinus with nonspecific ST changes  Hypernatremia -start hypotonic saline-->improved    Disposition Plan:   SNF in 2-3 days  Family Communication: Daughter updated at bedside 6/26--Total time spent 35 minutes.  Greater than 50% spent face to face counseling and  coordinating care.   Consultants:  none  Code Status:  FULL  DVT Prophylaxis:  [place] Heparin    Procedures: As Listed in Progress Note Above  Antibiotics: vanco 6/24>>> Aztreonam 6/23>>> Levofloxacin 6/23 x 1  Subjective: Patient is awake and alert. No reports of vomiting, or throat distress, uncontrolled pain, diarrhea. Remainder review of systems is unobtainable secondary to patient's dementia.  Objective: Vitals:   06/21/17 1858 06/21/17 2003 06/22/17 0436 06/22/17 1354  BP:  (!) 163/56 (!) 162/77 (!) 157/54  Pulse:  70 67 (!) 58  Resp:  18 20 18   Temp:  98.4 F (36.9 C) 97.7 F (36.5 C) 98.4 F (36.9 C)  TempSrc:  Oral Oral Oral  SpO2: 95% 97% 96% 100%  Weight:      Height:        Intake/Output Summary (Last 24 hours) at 06/22/17 1645 Last data filed at 06/22/17 1323  Gross per 24 hour  Intake          2256.25 ml  Output             3275 ml  Net         -1018.75 ml   Weight change:  Exam:   General:  Pt is alert, follows commands appropriately, not in acute distress  HEENT: No icterus, No thrush, No neck mass, [place]/AT  Cardiovascular: RRR, S1/S2, no rubs, no gallops  Respiratory: Bibasilar crackles. No wheeze.  Abdomen: Soft/+BS, non tender, non distended, no guarding  Extremities: No edema, No lymphangitis, No petechiae, No rashes, no synovitis   Data Reviewed: I have personally reviewed following labs and imaging studies Basic Metabolic Panel:  Recent Labs Lab 06/19/17 2238 06/20/17 0443 06/21/17 0642 06/22/17 0641  NA 153* 147*  148* 141  K 3.6 3.4* 3.8 3.6  CL 116* 115* 117* 107  CO2 24 22 25 25   GLUCOSE 113* 147* 88 92  BUN 45* 40* 27* 19  CREATININE 3.01* 2.34* 1.36* 1.12*  CALCIUM 8.6* 7.4* 7.8* 8.1*  MG  --   --   --  1.4*   Liver Function Tests:  Recent Labs Lab 06/19/17 2238 06/20/17 0443  AST 19 16  ALT 9* 8*  ALKPHOS 87 58  BILITOT 0.5 0.4  PROT 7.8 5.8*  ALBUMIN 3.4* 2.4*   No results for input(s): LIPASE,  AMYLASE in the last 168 hours. No results for input(s): AMMONIA in the last 168 hours. Coagulation Profile:  Recent Labs Lab 06/19/17 2238  INR 1.11   CBC:  Recent Labs Lab 06/19/17 2238 06/20/17 0443 06/21/17 0642 06/22/17 0641  WBC 15.3* 22.2* 15.0* 10.9*  NEUTROABS 14.5*  --   --   --   HGB 14.0 10.6* 11.1* 11.4*  HCT 44.3 34.6* 36.6 37.6  MCV 86.7 87.2 87.6 86.4  PLT 226 212 203 239   Cardiac Enzymes:  Recent Labs Lab 06/19/17 2238  TROPONINI 0.06*   BNP: Invalid input(s): POCBNP CBG: No results for input(s): GLUCAP in the last 168 hours. HbA1C: No results for input(s): HGBA1C in the last 72 hours. Urine analysis:    Component Value Date/Time   COLORURINE AMBER (A) 06/19/2017 2208   APPEARANCEUR TURBID (A) 06/19/2017 2208   LABSPEC 1.014 06/19/2017 2208   PHURINE 5.0 06/19/2017 2208   GLUCOSEU NEGATIVE 06/19/2017 2208   HGBUR MODERATE (A) 06/19/2017 2208   BILIRUBINUR NEGATIVE 06/19/2017 2208   KETONESUR NEGATIVE 06/19/2017 2208   PROTEINUR 100 (A) 06/19/2017 2208   UROBILINOGEN 0.2 04/10/2013 1230   NITRITE NEGATIVE 06/19/2017 2208   LEUKOCYTESUR MODERATE (A) 06/19/2017 2208   Sepsis Labs: @LABRCNTIP (procalcitonin:4,lacticidven:4) ) Recent Results (from the past 240 hour(s))  Urine culture     Status: None (Preliminary result)   Collection Time: 06/19/17 10:08 PM  Result Value Ref Range Status   Specimen Description URINE, CATHETERIZED  Final   Special Requests NONE  Final   Culture   Final    CULTURE REINCUBATED FOR BETTER GROWTH Performed at [facility], [address], [place], [place] [address]    Report Status PENDING  Incomplete  Culture, blood (Routine x 2)     Status: None (Preliminary result)   Collection Time: 06/19/17 10:20 PM  Result Value Ref Range Status   Specimen Description BLOOD LEFT HAND  Final   Special Requests   Final    Blood Culture results may not be optimal due to an inadequate volume of blood received in  culture bottles   Culture  Setup Time   Final    GRAM POSITIVE COCCI Gram Stain Report Called to,Read Back By and Verified With: HAWKINS,M @ 1157 ON 6.25.18 BY BAYSE,L    Culture GRAM POSITIVE COCCI  Final   Report Status PENDING  Incomplete  Culture, blood (Routine x 2)     Status: None (Preliminary result)   Collection Time: 06/19/17 10:50 PM  Result Value Ref Range Status   Specimen Description RIGHT ANTECUBITAL  Final   Special Requests Blood Culture adequate volume  Final   Culture  Setup Time   Final    GRAM POSITIVE COCCI ANAEROBIC BOTTLE Gram Stain Report Called to,Read Back By and Verified With: LIGHTNER,L@659  BY MATTHEWS,B [address] [facility]  CRITICAL RESULT CALLED TO, READ BACK BY AND VERIFIED  WITHScharlene Gloss, PHARMD 1610 06/21/2017 T. TYSOR    Culture GRAM POSITIVE COCCI  Final   Report Status PENDING  Incomplete  Blood Culture ID Panel (Reflexed)     Status: None   Collection Time: 06/19/17 10:50 PM  Result Value Ref Range Status   Enterococcus species NOT DETECTED NOT DETECTED Final   Listeria monocytogenes NOT DETECTED NOT DETECTED Final   Staphylococcus species NOT DETECTED NOT DETECTED Final   Staphylococcus aureus NOT DETECTED NOT DETECTED Final   Streptococcus species NOT DETECTED NOT DETECTED Final   Streptococcus agalactiae NOT DETECTED NOT DETECTED Final   Streptococcus pneumoniae NOT DETECTED NOT DETECTED Final   Streptococcus pyogenes NOT DETECTED NOT DETECTED Final   Acinetobacter baumannii NOT DETECTED NOT DETECTED Final   Enterobacteriaceae species NOT DETECTED NOT DETECTED Final   Enterobacter cloacae complex NOT DETECTED NOT DETECTED Final   Escherichia coli NOT DETECTED NOT DETECTED Final   Klebsiella oxytoca NOT DETECTED NOT DETECTED Final   Klebsiella pneumoniae NOT DETECTED NOT DETECTED Final   Proteus species NOT DETECTED NOT DETECTED Final   Serratia marcescens NOT DETECTED NOT DETECTED Final   Haemophilus influenzae NOT DETECTED NOT  DETECTED Final   Neisseria meningitidis NOT DETECTED NOT DETECTED Final   Pseudomonas aeruginosa NOT DETECTED NOT DETECTED Final   Candida albicans NOT DETECTED NOT DETECTED Final   Candida glabrata NOT DETECTED NOT DETECTED Final   Candida krusei NOT DETECTED NOT DETECTED Final   Candida parapsilosis NOT DETECTED NOT DETECTED Final   Candida tropicalis NOT DETECTED NOT DETECTED Final  MRSA PCR Screening     Status: None   Collection Time: 06/20/17  2:12 AM  Result Value Ref Range Status   MRSA by PCR NEGATIVE NEGATIVE Final    Comment:        The GeneXpert MRSA Assay (FDA approved for NASAL specimens only), is one component of a comprehensive MRSA colonization surveillance program. It is not intended to diagnose MRSA infection nor to guide or monitor treatment for MRSA infections.   Culture, blood (Routine X 2) w Reflex to ID Panel     Status: None (Preliminary result)   Collection Time: 06/22/17  1:37 PM  Result Value Ref Range Status   Specimen Description BLOOD LEFT ANTECUBITAL  Final   Special Requests   Final    BOTTLES DRAWN AEROBIC AND ANAEROBIC Blood Culture adequate volume   Culture NO GROWTH <12 HOURS  Final   Report Status PENDING  Incomplete  Culture, blood (Routine X 2) w Reflex to ID Panel     Status: None (Preliminary result)   Collection Time: 06/22/17  1:56 PM  Result Value Ref Range Status   Specimen Description BLOOD  Final   Special Requests NONE  Final   Culture NO GROWTH <12 HOURS  Final   Report Status PENDING  Incomplete     Scheduled Meds: . memantine  14 mg Oral QHS   And  . donepezil  10 mg Oral QHS  . feeding supplement (ENSURE ENLIVE)  237 mL Oral BID BM  . heparin  5,000 Units Subcutaneous Q8H  . ketotifen  1 drop Both Eyes BID  . pantoprazole  40 mg Oral Daily  . sodium chloride flush  3 mL Intravenous Q12H   Continuous Infusions: . 0.45 % NaCl with KCl 20 mEq / L Stopped (06/22/17 1317)  . aztreonam Stopped (06/22/17 1347)  .  vancomycin Stopped (06/21/17 1941)    Procedures/Studies: Dg Chest Portable 1 View  Result  Date: 06/19/2017 CLINICAL DATA:  Cough EXAM: PORTABLE CHEST 1 VIEW COMPARISON:  Chest x-ray dated 03/24/2016. FINDINGS: Heart size and mediastinal contours are stable. Coarse interstitial lung markings suggests chronic interstitial lung disease. No new lung findings, although the left lung base is poorly seen due to overlying heart shadow. No pleural effusion or pneumothorax seen. Osseous structures about the chest are unremarkable. IMPRESSION: 1. Left lower lung is obscured by the overlying heart shadow. Given the patient's clinical presentation, would consider a lateral view if possible to exclude left lower lobe pneumonia. 2. No evidence of pneumonia within the remainder of the visualized lungs. 3. Probable chronic interstitial lung disease. 4. Stable cardiomegaly. Electronically Signed   By: Bary RichardStan  Maynard M.D.   On: 06/19/2017 22:56    Ailene Royal, DO  [facility] Pager [phone]  If 7PM-7AM, please contact night-coverage [email or web address] Password [email or web address] 06/22/2017, 4:45 PM   LOS: 2 days

## 2017-06-23 ENCOUNTER — Inpatient Hospital Stay (HOSPITAL_COMMUNITY): Payer: PPO

## 2017-06-23 DIAGNOSIS — E860 Dehydration: Secondary | ICD-10-CM

## 2017-06-23 LAB — BASIC METABOLIC PANEL
ANION GAP: 8 (ref 5–15)
BUN: 16 mg/dL (ref 6–20)
CALCIUM: 8.3 mg/dL — AB (ref 8.9–10.3)
CO2: 25 mmol/L (ref 22–32)
Chloride: 105 mmol/L (ref 101–111)
Creatinine, Ser: 1.07 mg/dL — ABNORMAL HIGH (ref 0.44–1.00)
GFR calc Af Amer: 51 mL/min — ABNORMAL LOW (ref >60)
GFR, EST NON AFRICAN AMERICAN: 44 mL/min — AB (ref >60)
Glucose, Bld: 119 mg/dL — ABNORMAL HIGH (ref 65–99)
Potassium: 3.8 mmol/L (ref 3.5–5.1)
Sodium: 138 mmol/L (ref 135–145)

## 2017-06-23 LAB — CBC
HEMATOCRIT: 39.5 % (ref 36.0–46.0)
Hemoglobin: 12.5 g/dL (ref 12.0–15.0)
MCH: 26.8 pg (ref 26.0–34.0)
MCHC: 31.6 g/dL (ref 30.0–36.0)
MCV: 84.6 fL (ref 78.0–100.0)
Platelets: 278 10*3/uL (ref 150–400)
RBC: 4.67 MIL/uL (ref 3.87–5.11)
RDW: 14.2 % (ref 11.5–15.5)
WBC: 9 10*3/uL (ref 4.0–10.5)

## 2017-06-23 LAB — CULTURE, BLOOD (ROUTINE X 2)

## 2017-06-23 MED ORDER — LINEZOLID 600 MG PO TABS
600.0000 mg | ORAL_TABLET | Freq: Two times a day (BID) | ORAL | Status: DC
  Administered 2017-06-23 – 2017-06-24 (×2): 600 mg via ORAL
  Filled 2017-06-23 (×4): qty 1

## 2017-06-23 NOTE — Progress Notes (Signed)
PHARMACY - PHYSICIAN COMMUNICATION CRITICAL VALUE ALERT - BLOOD CULTURE IDENTIFICATION (BCID)  Results for orders placed or performed during the hospital encounter of 06/19/17  Blood Culture ID Panel (Reflexed) (Collected: 06/19/2017 10:50 PM)  Result Value Ref Range   Enterococcus species NOT DETECTED NOT DETECTED   Listeria monocytogenes NOT DETECTED NOT DETECTED   Staphylococcus species NOT DETECTED NOT DETECTED   Staphylococcus aureus NOT DETECTED NOT DETECTED   Streptococcus species NOT DETECTED NOT DETECTED   Streptococcus agalactiae NOT DETECTED NOT DETECTED   Streptococcus pneumoniae NOT DETECTED NOT DETECTED   Streptococcus pyogenes NOT DETECTED NOT DETECTED   Acinetobacter baumannii NOT DETECTED NOT DETECTED   Enterobacteriaceae species NOT DETECTED NOT DETECTED   Enterobacter cloacae complex NOT DETECTED NOT DETECTED   Escherichia coli NOT DETECTED NOT DETECTED   Klebsiella oxytoca NOT DETECTED NOT DETECTED   Klebsiella pneumoniae NOT DETECTED NOT DETECTED   Proteus species NOT DETECTED NOT DETECTED   Serratia marcescens NOT DETECTED NOT DETECTED   Haemophilus influenzae NOT DETECTED NOT DETECTED   Neisseria meningitidis NOT DETECTED NOT DETECTED   Pseudomonas aeruginosa NOT DETECTED NOT DETECTED   Candida albicans NOT DETECTED NOT DETECTED   Candida glabrata NOT DETECTED NOT DETECTED   Candida krusei NOT DETECTED NOT DETECTED   Candida parapsilosis NOT DETECTED NOT DETECTED   Candida tropicalis NOT DETECTED NOT DETECTED   Name of physician (or Provider) Contacted: Dr Tat  Changes to prescribed antibiotics required: none:  Continue Vancomycin and Aztreonam, f/u finalized cultures, consider ID input  Valrie HartHall, Ilissa Rosner A 06/23/2017  8:37 AM

## 2017-06-23 NOTE — Progress Notes (Signed)
Pharmacy Antibiotic Note  Dawn Lee is a 83 y.o. female admitted on 06/19/2017 with sepsis.  Pharmacy has been consulted for vancomycin and aztreonam dosing.  Afebrile, WBC improved.  BCx with GPC, final ID pending.  Plan: Cont aztreonam 1 gm IV q8 hours Cont vanc 1gm IV q48 hours F/u renal function, cultures and clinical course  Height: 5\' 4"  (162.6 cm) Weight: 149 lb 0.5 oz (67.6 kg) IBW/kg (Calculated) : 54.7  Temp (24hrs), Avg:98.2 F (36.8 C), Min:98 F (36.7 C), Max:98.4 F (36.9 C)   Recent Labs Lab 06/19/17 2238 06/19/17 2303 06/20/17 0443 06/21/17 0642 06/22/17 0641 06/23/17 0627  WBC 15.3*  --  22.2* 15.0* 10.9* 9.0  CREATININE 3.01*  --  2.34* 1.36* 1.12* 1.07*  LATICACIDVEN  --  2.61* 1.5  --   --   --     Estimated Creatinine Clearance: 31.7 mL/min (A) (by C-G formula based on SCr of 1.07 mg/dL (H)).    Allergies  Allergen Reactions  . Penicillins     Has patient had a PCN reaction causing immediate rash, facial/tongue/throat swelling, SOB or lightheadedness with hypotension: Unknown Has patient had a PCN reaction causing severe rash involving mucus membranes or skin necrosis: Unknown Has patient had a PCN reaction that required hospitalization: Unknown Has patient had a PCN reaction occurring within the last 10 years: Unknown If all of the above answers are "NO", then may proceed with Cephalosporin use.    BCx:  Pending, GPC MRSA PCR: negative  Thank you for allowing pharmacy to be a part of this patient's care.  Valrie HartHall, Kysa Calais A 06/23/2017 8:33 AM

## 2017-06-23 NOTE — Evaluation (Signed)
Physical Therapy Evaluation Patient Details Name: Dawn Lee MRN: [id number] DOB: 09/15/1923 Today's Date: 06/23/2017   History of Present Illness  Dawn Lee is a 83yo white female who [facility] SNF witha fever of 104 degrees. Pt admitted d/t sepsis from UTI. PMH: advanced dementia, remote Rt hip ORIF (27YA). Additional PLOF info is unavailable at this time, and limited in the medical record.     Clinical Impression  Pt admitted with above diagnosis. Pt currently with functional limitations due to the deficits listed below (see "PT Problem List"). Upon entry, the patient is received semirecumbent in bed, no family/caregiver present.   The pt is awake, appearing in no acute distress at this time, smiling and interactive. The pt is oriented x1, difficulty understanding her spoken name due to her moderate dysarthria and hypophonia. The patient follows <25% of verbal cues, <50% of tactile/visual cues. The patient is unable to follow along with multi-step commands. Simple mobility in bed reveals moderate weakness, with physical assistance require to achieve rolling bilat. Manual muscle testing reveals mild to moderate weakness in BUE, but grips are strong. No baseline cognition is established at this time, but currently patient is unable to participate with PT due to cognitive deficitis. Weakness and confusion indicative of continued care and supervision needed at DC. No additional skilled PT services are needed at this time. PT signing off.       Follow Up Recommendations Supervision/Assistance - 24 hour;Supervision for mobility/OOB    Equipment Recommendations  None recommended by PT    Recommendations for Other Services       Precautions / Restrictions Precautions Precautions: None Restrictions Weight Bearing Restrictions: No      Mobility  Bed Mobility Overal bed mobility: Needs Assistance Bed Mobility: Rolling Rolling: Max assist;+2 for physical assistance          General bed mobility comments: following cues for participation 25% of time, but even when participating, weakness apparent.          General transfer comment: not attempted due to AMS, limited ability to follow commands.        Pertinent Vitals/Pain Pain Assessment:  (appears pleasant, smiling, no grimacing. ) Pain Intervention(s): Monitored during session;Limited activity within patient's tolerance    Home Living Family/patient expects to be discharged to:: Skilled nursing facility                      Prior Function Level of Independence: Needs assistance         Comments: no baseline info availabel at time of evalaution      Hand Dominance        Extremity/Trunk Assessment   Upper Extremity Assessment Upper Extremity Assessment: Difficult to assess due to impaired cognition (grossly 4-/5 in BUE, grips strong and symetrical )    Lower Extremity Assessment Lower Extremity Assessment: Difficult to assess due to impaired cognition (no following commands for testing)       Communication   Communication: Expressive difficulties (moderate dysarthria, mild hypophonia, mild HOH)  Cognition Arousal/Alertness: Awake/alert Behavior During Therapy:  (cooperative, participatory 25-50% of time, difficult following instructions. ) Overall Cognitive Status: History of cognitive impairments - at baseline                                        General Comments      Exercises  Assessment/Plan    PT Assessment Patent does not need any further PT services  PT Problem List         PT Treatment Interventions      PT Goals (Current goals can be found in the Care Plan section)  Acute Rehab PT Goals PT Goal Formulation: All assessment and education complete, DC therapy    Frequency     Barriers to discharge        Co-evaluation               AM-PAC PT "6 Clicks" Daily Activity  Outcome Measure Difficulty turning over in  bed (including adjusting bedclothes, sheets and blankets)?: Total Difficulty moving from lying on back to sitting on the side of the bed? : Total Difficulty sitting down on and standing up from a chair with arms (e.g., wheelchair, bedside commode, etc,.)?: Total Help needed moving to and from a bed to chair (including a wheelchair)?: Total Help needed walking in hospital room?: Total Help needed climbing 3-5 steps with a railing? : Total 6 Click Score: 6    End of Session   Activity Tolerance: Patient tolerated treatment well;No increased pain Patient left: in bed;with bed alarm set;with call bell/phone within reach (safety mittens reapplied; bed  elevated and legs elevated. )   PT Visit Diagnosis: Muscle weakness (generalized) (M62.81);Other symptoms and signs involving the nervous system (R29.898)    Time: [id number] PT Time Calculation (min) (ACUTE ONLY): 14 min   Charges:   PT Evaluation $PT Eval Low Complexity: 1 Procedure     PT G Codes:        11:55 AM, 06/23/17 Rosamaria LintsAllan C Pearley Millington, PT, DPT Physical Therapist - [place] [phone](ASCOM)  [phone] (Office)   Vannesa Abair C 06/23/2017, 11:54 AM

## 2017-06-23 NOTE — Progress Notes (Signed)
PROGRESS NOTE  Dawn Lee:[id number] DOB: 1923-01-19 DOA: 06/19/2017 PCP: [name], MD  Brief History:  83 year old female with a history of hypertension, dementia, and GERD presented from SNF with altered mental status. Unfortunately, the patient is unable to provide any history secondary to dementia. According to the SNF staff, the patient was "not acting like herself", and the patient was "warm to touch".  According to the patient's daughter, the patient has been more somnolent beginning on 06/17/2017. There are no reports of vomiting, diarrhea, respiratory distress. In the emergency department, the patient was found to have WBC 15.3 and fever 104.44F rectally with lactic acid 2.61. The patient was started on IV fluids, vancomycin and aztreonam.   Assessment/Plan: Sepsis  -present at the time of admission -Secondary to UTI and bacteremia  -Treated with intravenous antibiotics, vancomycin and aztreonam  -Lactic acid 2.61 >>> 1.5  -Chest x-ray --chronic interstitial lung disease without infiltrates  -Hemodynamics are stable, discontinue IV fluids.   Bacteremia -2 sets of blood cultures positive for aerococcus urinae -likely urinary source -Patient has been on treatment with vancomycin and aztreonam. -Case discussed with Dr. Algis Liming on call for infectious disease who recommended changing antibiotics to oral linezolid for a total of 10 days. -Repeat blood cultures from 6/26 show no growth  UTI -UA with TNTC WBC -Final urine culture results currently in process  AKI -baseline creatinine 0.9-1.0 -presenting creatining 3.01 -Resolved with IV fluids. Creatinine is back to baseline.  Acute metabolic encephalopathy -Secondary to sepsis and AKI -Improving with antibiotics and fluids -6/26--daughter states pt is close to baseline  Dementia without behavioral disturbance -continue namenda and aricept   Elevated troponin -due demand ischemia -personally  reviewed EKG--sinus with nonspecific ST changes -No complaints of chest pain.  Hypernatremia -Likely related to volume depletion and dehydration. Improved with hypotonic fluids.    Disposition Plan:   SNF in 2-3 days  Family Communication: No family present. Total time spent 25 minutes.  Greater than 50% spent face to face counseling and coordinating care.   Consultants:   infectious disease (Phone)  Code Status:  DNR  DVT Prophylaxis:  [place] Heparin    Procedures: As Listed in Progress Note Above  Antibiotics: vanco 6/24>>>6/27 Aztreonam 6/23>>>6/27 Levofloxacin 6/23 x 1 Linezolid 6/27>>  Subjective: Patient denies any pain, shortness of breath.  Objective: Vitals:   06/22/17 1354 06/22/17 2106 06/23/17 0706 06/23/17 1400  BP: (!) 157/54 (!) 158/59 (!) 144/66 (!) 124/51  Pulse: (!) 58 60 64 (!) 58  Resp: 18 18 20 19   Temp: 98.4 F (36.9 C) 98.2 F (36.8 C) 98 F (36.7 C) 97.3 F (36.3 C)  TempSrc: Oral Oral Oral Axillary  SpO2: 100% 100% 100% 99%  Weight:      Height:        Intake/Output Summary (Last 24 hours) at 06/23/17 1709 Last data filed at 06/23/17 1200  Gross per 24 hour  Intake             1005 ml  Output             2875 ml  Net            -1870 ml   Weight change:  Exam:  General exam: Alert, awakeNot distress, Respiratory system: Clear to auscultation. Respiratory effort normal. Cardiovascular system:RRR. No murmurs, rubs, gallops. Gastrointestinal system: Abdomen is nondistended, soft and nontender. No organomegaly or masses felt. Normal bowel sounds heard.  Central nervous system: Alert and oriented. No focal neurological deficits. Extremities: No C/C/E, +pedal pulses Skin: No rashes, lesions or ulcers Psychiatry:Confused, pleasant  Data Reviewed: I have personally reviewed following labs and imaging studies Basic Metabolic Panel:  Recent Labs Lab 06/19/17 2238 06/20/17 0443 06/21/17 0642 06/22/17 0641 06/23/17 0627    NA 153* 147* 148* 141 138  K 3.6 3.4* 3.8 3.6 3.8  CL 116* 115* 117* 107 105  CO2 24 22 25 25 25   GLUCOSE 113* 147* 88 92 119*  BUN 45* 40* 27* 19 16  CREATININE 3.01* 2.34* 1.36* 1.12* 1.07*  CALCIUM 8.6* 7.4* 7.8* 8.1* 8.3*  MG  --   --   --  1.4*  --    Liver Function Tests:  Recent Labs Lab 06/19/17 2238 06/20/17 0443  AST 19 16  ALT 9* 8*  ALKPHOS 87 58  BILITOT 0.5 0.4  PROT 7.8 5.8*  ALBUMIN 3.4* 2.4*   No results for input(s): LIPASE, AMYLASE in the last 168 hours. No results for input(s): AMMONIA in the last 168 hours. Coagulation Profile:  Recent Labs Lab 06/19/17 2238  INR 1.11   CBC:  Recent Labs Lab 06/19/17 2238 06/20/17 0443 06/21/17 0642 06/22/17 0641 06/23/17 0627  WBC 15.3* 22.2* 15.0* 10.9* 9.0  NEUTROABS 14.5*  --   --   --   --   HGB 14.0 10.6* 11.1* 11.4* 12.5  HCT 44.3 34.6* 36.6 37.6 39.5  MCV 86.7 87.2 87.6 86.4 84.6  PLT 226 212 203 239 278   Cardiac Enzymes:  Recent Labs Lab 06/19/17 2238  TROPONINI 0.06*   BNP: Invalid input(s): POCBNP CBG: No results for input(s): GLUCAP in the last 168 hours. HbA1C: No results for input(s): HGBA1C in the last 72 hours. Urine analysis:    Component Value Date/Time   COLORURINE AMBER (A) 06/19/2017 2208   APPEARANCEUR TURBID (A) 06/19/2017 2208   LABSPEC 1.014 06/19/2017 2208   PHURINE 5.0 06/19/2017 2208   GLUCOSEU NEGATIVE 06/19/2017 2208   HGBUR MODERATE (A) 06/19/2017 2208   BILIRUBINUR NEGATIVE 06/19/2017 2208   KETONESUR NEGATIVE 06/19/2017 2208   PROTEINUR 100 (A) 06/19/2017 2208   UROBILINOGEN 0.2 04/10/2013 1230   NITRITE NEGATIVE 06/19/2017 2208   LEUKOCYTESUR MODERATE (A) 06/19/2017 2208   Sepsis Labs: @LABRCNTIP (procalcitonin:4,lacticidven:4) ) Recent Results (from the past 240 hour(s))  Urine culture     Status: None (Preliminary result)   Collection Time: 06/19/17 10:08 PM  Result Value Ref Range Status   Specimen Description URINE, CATHETERIZED  Final    Special Requests NONE  Final   Culture   Final    CULTURE REINCUBATED FOR BETTER GROWTH Performed at [facility], [address] [place], [place] [address]    Report Status PENDING  Incomplete  Culture, blood (Routine x 2)     Status: Abnormal   Collection Time: 06/19/17 10:20 PM  Result Value Ref Range Status   Specimen Description BLOOD LEFT HAND  Final   Special Requests   Final    Blood Culture results may not be optimal due to an inadequate volume of blood received in culture bottles   Culture  Setup Time   Final    GRAM POSITIVE COCCI Gram Stain Report Called to,Read Back By and Verified With: HAWKINS,M @ 1157 ON 6.25.18 BY BAYSE,L    Culture (A)  Final    AEROCOCCUS URINAE Standardized susceptibility testing for this organism is not available. Performed at [facility], [address] [place], [place] [address]    Report Status 06/23/2017 FINAL  Final  Culture, blood (Routine x 2)     Status: Abnormal (Preliminary result)   Collection Time: 06/19/17 10:50 PM  Result Value Ref Range Status   Specimen Description RIGHT ANTECUBITAL  Final   Special Requests Blood Culture adequate volume  Final   Culture  Setup Time   Final    GRAM POSITIVE COCCI ANAEROBIC BOTTLE Gram Stain Report Called to,Read Back By and Verified With: LIGHTNER,L@659  BY MATTHEWS,B [id number] [facility]  CRITICAL RESULT CALLED TO, READ BACK BY AND VERIFIED WITH: S. HALL, PHARMD 1522 06/21/2017 T. TYSOR IN BOTH AEROBIC AND ANAEROBIC BOTTLES CRITICAL VALUE NOTED.  VALUE IS CONSISTENT WITH PREVIOUSLY REPORTED AND CALLED VALUE.    Culture (A)  Final    AEROCOCCUS SPECIES Standardized susceptibility testing for this organism is not available. Performed at [facility], [address], [place], [place] [address]    Report Status PENDING  Incomplete  Blood Culture ID Panel (Reflexed)     Status: None   Collection Time: 06/19/17 10:50 PM  Result Value Ref Range Status   Enterococcus  species NOT DETECTED NOT DETECTED Final   Listeria monocytogenes NOT DETECTED NOT DETECTED Final   Staphylococcus species NOT DETECTED NOT DETECTED Final   Staphylococcus aureus NOT DETECTED NOT DETECTED Final   Streptococcus species NOT DETECTED NOT DETECTED Final   Streptococcus agalactiae NOT DETECTED NOT DETECTED Final   Streptococcus pneumoniae NOT DETECTED NOT DETECTED Final   Streptococcus pyogenes NOT DETECTED NOT DETECTED Final   Acinetobacter baumannii NOT DETECTED NOT DETECTED Final   Enterobacteriaceae species NOT DETECTED NOT DETECTED Final   Enterobacter cloacae complex NOT DETECTED NOT DETECTED Final   Escherichia coli NOT DETECTED NOT DETECTED Final   Klebsiella oxytoca NOT DETECTED NOT DETECTED Final   Klebsiella pneumoniae NOT DETECTED NOT DETECTED Final   Proteus species NOT DETECTED NOT DETECTED Final   Serratia marcescens NOT DETECTED NOT DETECTED Final   Haemophilus influenzae NOT DETECTED NOT DETECTED Final   Neisseria meningitidis NOT DETECTED NOT DETECTED Final   Pseudomonas aeruginosa NOT DETECTED NOT DETECTED Final   Candida albicans NOT DETECTED NOT DETECTED Final   Candida glabrata NOT DETECTED NOT DETECTED Final   Candida krusei NOT DETECTED NOT DETECTED Final   Candida parapsilosis NOT DETECTED NOT DETECTED Final   Candida tropicalis NOT DETECTED NOT DETECTED Final  MRSA PCR Screening     Status: None   Collection Time: 06/20/17  2:12 AM  Result Value Ref Range Status   MRSA by PCR NEGATIVE NEGATIVE Final    Comment:        The GeneXpert MRSA Assay (FDA approved for NASAL specimens only), is one component of a comprehensive MRSA colonization surveillance program. It is not intended to diagnose MRSA infection nor to guide or monitor treatment for MRSA infections.   Culture, blood (Routine X 2) w Reflex to ID Panel     Status: None (Preliminary result)   Collection Time: 06/22/17  1:37 PM  Result Value Ref Range Status   Specimen Description  BLOOD LEFT ANTECUBITAL  Final   Special Requests   Final    BOTTLES DRAWN AEROBIC AND ANAEROBIC Blood Culture adequate volume   Culture NO GROWTH < 24 HOURS  Final   Report Status PENDING  Incomplete  Culture, blood (Routine X 2) w Reflex to ID Panel     Status: None (Preliminary result)   Collection Time: 06/22/17  1:56 PM  Result Value Ref Range Status   Specimen Description BLOOD  Final   Special Requests NONE  Final   Culture NO GROWTH < 24 HOURS  Final   Report Status PENDING  Incomplete     Scheduled Meds: . memantine  14 mg Oral QHS   And  . donepezil  10 mg Oral QHS  . feeding supplement (ENSURE ENLIVE)  237 mL Oral BID BM  . heparin  5,000 Units Subcutaneous Q8H  . ketotifen  1 drop Both Eyes BID  . linezolid  600 mg Oral Q12H  . pantoprazole  40 mg Oral Daily  . sodium chloride flush  3 mL Intravenous Q12H   Continuous Infusions: . 0.45 % NaCl with KCl 20 mEq / L 75 mL/hr at 06/23/17 1055    Procedures/Studies: Dg Chest Portable 1 View  Result Date: 06/19/2017 CLINICAL DATA:  Cough EXAM: PORTABLE CHEST 1 VIEW COMPARISON:  Chest x-ray dated 03/24/2016. FINDINGS: Heart size and mediastinal contours are stable. Coarse interstitial lung markings suggests chronic interstitial lung disease. No new lung findings, although the left lung base is poorly seen due to overlying heart shadow. No pleural effusion or pneumothorax seen. Osseous structures about the chest are unremarkable. IMPRESSION: 1. Left lower lung is obscured by the overlying heart shadow. Given the patient's clinical presentation, would consider a lateral view if possible to exclude left lower lobe pneumonia. 2. No evidence of pneumonia within the remainder of the visualized lungs. 3. Probable chronic interstitial lung disease. 4. Stable cardiomegaly. Electronically Signed   By: Bary Richard M.D.   On: 06/19/2017 22:56    Sima Lindenberger, MD  [facility] Pager [phone]  If 7PM-7AM, please contact  night-coverage [email or web address] Password [email or web address] 06/23/2017, 5:09 PM   LOS: 3 days

## 2017-06-23 NOTE — Care Management Important Message (Signed)
Important Message  Patient Details  Name: Dawn Lee MRN: [id number] Date of Birth: 05/24/1923   Medicare Important Message Given:  Yes    Zackery Brine, Chrystine OilerSharley Diane, RN 06/23/2017, 3:43 PM

## 2017-06-24 ENCOUNTER — Inpatient Hospital Stay (HOSPITAL_COMMUNITY): Payer: PPO

## 2017-06-24 DIAGNOSIS — R7881 Bacteremia: Secondary | ICD-10-CM

## 2017-06-24 LAB — URINE CULTURE

## 2017-06-24 LAB — ECHOCARDIOGRAM COMPLETE
HEIGHTINCHES: 64 in
WEIGHTICAEL: 2384.5 [oz_av]

## 2017-06-24 MED ORDER — LINEZOLID 600 MG PO TABS: 600.0000 mg | ORAL_TABLET | Freq: Two times a day (BID) | ORAL | 0 refills | Status: AC

## 2017-06-24 NOTE — Discharge Summary (Signed)
Physician Discharge Summary  Dawn Lee:[id number] DOB: [birth year] DOA: 06/19/2017  PCP: [name], MD  Admit date: 06/19/2017 Discharge date: 06/24/2017  Admitted From: ALF Disposition:  ALF  Recommendations for Outpatient Follow-up:  1. Follow up with PCP in 1-2 weeks 2. Please obtain BMP/CBC in one week  Discharge Condition: stable CODE STATUS: DNR Diet recommendation: soft foods   Brief/Interim Summary: 83 year old female with a history of hypertension, dementia, and GERD presented from [name] altered mental status. Unfortunately, the patient is unable to provide any history secondary to dementia. According to the SNF staff, the patient was "not acting like herself", and the patient was "warm to touch". According to the patient's daughter, the patient has been more somnolent beginning on 06/17/2017. There are no reports of vomiting, diarrhea, respiratory distress. In the emergency department, the patient was found to have WBC 15.3 and fever 104.58F rectally with lactic acid 2.61. The patient was started on IV fluids, vancomycin and aztreonam.  Discharge Diagnoses:  Principal Problem:   Sepsis secondary to UTI [facility]) Active Problems:   Dementia   DNR no code (do not resuscitate)   AKI (acute kidney injury) (HCC)   Hypernatremia   Dehydration, moderate   Elevated troponin I level   Bacteremia due to Gram-positive bacteria   Sepsis due to undetermined organism (HCC)   Urinary tract infection without hematuria  Sepsis  -present at the time of admission -Secondary to UTI and bacteremia  -Treated with intravenous antibiotics, vancomycin and aztreonam  -Lactic acid 2.61 >>>1.5  -Chest x-ray --chronic interstitial lung disease without infiltrates  -Hemodynamics are stable, discontinue IV fluids.   Bacteremia -2 sets of blood cultures positive for aerococcus urinae -likely urinary source -Patient has been on treatment with vancomycin and aztreonam. -Case  discussed with Dr. Algis Liming on call for infectious disease who recommended changing antibiotics to oral linezolid for a total of 10 days. -Repeat blood cultures from 6/26 show no growth  UTI -UA with TNTC WBC -urine culture positive for aerococcus urinae  AKI -baseline creatinine 0.9-1.0 -presenting creatining 3.01 -Resolved with IV fluids. Creatinine is back to baseline.  Acute metabolic encephalopathy -Secondary to sepsis and AKI -Improving with antibiotics and fluids -6/26--daughter states pt is close to baseline  Dementia without behavioral disturbance -continue namenda and aricept   Elevated troponin -due demand ischemia -personally reviewed EKG--sinus with nonspecific ST changes -No complaints of chest pain.  Hypernatremia -Likely related to volume depletion and dehydration. Improved with hypotonic fluids.  Discharge Instructions   Allergies as of 06/24/2017      Reactions   Penicillins    Has patient had a PCN reaction causing immediate rash, facial/tongue/throat swelling, SOB or lightheadedness with hypotension: Unknown Has patient had a PCN reaction causing severe rash involving mucus membranes or skin necrosis: Unknown Has patient had a PCN reaction that required hospitalization: Unknown Has patient had a PCN reaction occurring within the last 10 years: Unknown If all of the above answers are "NO", then may proceed with Cephalosporin use.      Medication List    TAKE these medications   acetaminophen 500 MG tablet Commonly known as:  TYLENOL Take 500 mg by mouth 2 (two) times daily.   acetaminophen 325 MG tablet Commonly known as:  TYLENOL Take 650 mg by mouth every 6 (six) hours as needed.   Cranberry 450 MG Tabs Take 450 mg by mouth daily.   ketotifen 0.025 % ophthalmic solution Commonly known as:  ZADITOR Apply 1 drop to  eye daily as needed (for dry eye).   linezolid 600 MG tablet Commonly known as:  ZYVOX Take 1 tablet (600 mg total) by  mouth every 12 (twelve) hours.   NAMZARIC 14-10 MG Cp24 Generic drug:  Memantine HCl-Donepezil HCl Take 1 capsule by mouth at bedtime.   nystatin ointment Commonly known as:  MYCOSTATIN Apply 1 application topically 2 (two) times daily. Vaginally for redness   pantoprazole 20 MG tablet Commonly known as:  PROTONIX Take 20 mg by mouth daily.      Contact information for after-discharge care    Destination    [facility] .   Specialty:  Assisted Living Facility Contact information: [address] [place] [address] [phone]             Allergies  Allergen Reactions  . Penicillins     Has patient had a PCN reaction causing immediate rash, facial/tongue/throat swelling, SOB or lightheadedness with hypotension: Unknown Has patient had a PCN reaction causing severe rash involving mucus membranes or skin necrosis: Unknown Has patient had a PCN reaction that required hospitalization: Unknown Has patient had a PCN reaction occurring within the last 10 years: Unknown If all of the above answers are "NO", then may proceed with Cephalosporin use.     Consultations:  Infectious disease (phone)   Procedures/Studies: Dg Chest Portable 1 View  Result Date: 06/19/2017 CLINICAL DATA:  Cough EXAM: PORTABLE CHEST 1 VIEW COMPARISON:  Chest x-ray dated 03/24/2016. FINDINGS: Heart size and mediastinal contours are stable. Coarse interstitial lung markings suggests chronic interstitial lung disease. No new lung findings, although the left lung base is poorly seen due to overlying heart shadow. No pleural effusion or pneumothorax seen. Osseous structures about the chest are unremarkable. IMPRESSION: 1. Left lower lung is obscured by the overlying heart shadow. Given the patient's clinical presentation, would consider a lateral view if possible to exclude left lower lobe pneumonia. 2. No evidence of pneumonia within the remainder of the visualized lungs. 3.  Probable chronic interstitial lung disease. 4. Stable cardiomegaly. Electronically Signed   By: Bary Richard M.D.   On: 06/19/2017 22:56       Subjective: confused  Discharge Exam: Vitals:   06/23/17 2124 06/24/17 0620  BP: (!) 170/54 (!) 156/72  Pulse: 70 80  Resp: 18 18  Temp: 98.7 F (37.1 C) 99.9 F (37.7 C)   Vitals:   06/23/17 0706 06/23/17 1400 06/23/17 2124 06/24/17 0620  BP: (!) 144/66 (!) 124/51 (!) 170/54 (!) 156/72  Pulse: 64 (!) 58 70 80  Resp: 20 19 18 18   Temp: 98 F (36.7 C) 97.3 F (36.3 C) 98.7 F (37.1 C) 99.9 F (37.7 C)  TempSrc: Oral Axillary Oral Oral  SpO2: 100% 99% 99% 95%  Weight:      Height:        General: Pt is alert, awake, not in acute distress Cardiovascular: RRR, S1/S2 +, no rubs, no gallops Respiratory: CTA bilaterally, no wheezing, no rhonchi Abdominal: Soft, NT, ND, bowel sounds + Extremities: no edema, no cyanosis    The results of significant diagnostics from this hospitalization (including imaging, microbiology, ancillary and laboratory) are listed below for reference.     Microbiology: Recent Results (from the past 240 hour(s))  Urine culture     Status: Abnormal   Collection Time: 06/19/17 10:08 PM  Result Value Ref Range Status   Specimen Description URINE, CATHETERIZED  Final   Special Requests NONE  Final   Culture >=  100,000 COLONIES/mL AEROCOCCUS URINAE (A)  Final   Report Status 06/24/2017 FINAL  Final  Culture, blood (Routine x 2)     Status: Abnormal   Collection Time: 06/19/17 10:20 PM  Result Value Ref Range Status   Specimen Description BLOOD LEFT HAND  Final   Special Requests   Final    Blood Culture results may not be optimal due to an inadequate volume of blood received in culture bottles   Culture  Setup Time   Final    GRAM POSITIVE COCCI Gram Stain Report Called to,Read Back By and Verified With: HAWKINS,M @ 1157 ON 6.25.18 BY BAYSE,L    Culture (A)  Final    AEROCOCCUS URINAE Standardized  susceptibility testing for this organism is not available. Performed at [facility], [address], [place], [place] [address]    Report Status 06/23/2017 FINAL  Final  Culture, blood (Routine x 2)     Status: Abnormal (Preliminary result)   Collection Time: 06/19/17 10:50 PM  Result Value Ref Range Status   Specimen Description RIGHT ANTECUBITAL  Final   Special Requests Blood Culture adequate volume  Final   Culture  Setup Time   Final    GRAM POSITIVE COCCI ANAEROBIC BOTTLE Gram Stain Report Called to,Read Back By and Verified With: LIGHTNER,L@659  BY MATTHEWS,B [id number] [facility]  CRITICAL RESULT CALLED TO, READ BACK BY AND VERIFIED WITH: S. HALL, PHARMD 1522 06/21/2017 T. TYSOR IN BOTH AEROBIC AND ANAEROBIC BOTTLES CRITICAL VALUE NOTED.  VALUE IS CONSISTENT WITH PREVIOUSLY REPORTED AND CALLED VALUE.    Culture (A)  Final    AEROCOCCUS SPECIES Standardized susceptibility testing for this organism is not available. Performed at [facility], [address], [place], [place] [address]    Report Status PENDING  Incomplete  Blood Culture ID Panel (Reflexed)     Status: None   Collection Time: 06/19/17 10:50 PM  Result Value Ref Range Status   Enterococcus species NOT DETECTED NOT DETECTED Final   Listeria monocytogenes NOT DETECTED NOT DETECTED Final   Staphylococcus species NOT DETECTED NOT DETECTED Final   Staphylococcus aureus NOT DETECTED NOT DETECTED Final   Streptococcus species NOT DETECTED NOT DETECTED Final   Streptococcus agalactiae NOT DETECTED NOT DETECTED Final   Streptococcus pneumoniae NOT DETECTED NOT DETECTED Final   Streptococcus pyogenes NOT DETECTED NOT DETECTED Final   Acinetobacter baumannii NOT DETECTED NOT DETECTED Final   Enterobacteriaceae species NOT DETECTED NOT DETECTED Final   Enterobacter cloacae complex NOT DETECTED NOT DETECTED Final   Escherichia coli NOT DETECTED NOT DETECTED Final   Klebsiella oxytoca NOT DETECTED NOT  DETECTED Final   Klebsiella pneumoniae NOT DETECTED NOT DETECTED Final   Proteus species NOT DETECTED NOT DETECTED Final   Serratia marcescens NOT DETECTED NOT DETECTED Final   Haemophilus influenzae NOT DETECTED NOT DETECTED Final   Neisseria meningitidis NOT DETECTED NOT DETECTED Final   Pseudomonas aeruginosa NOT DETECTED NOT DETECTED Final   Candida albicans NOT DETECTED NOT DETECTED Final   Candida glabrata NOT DETECTED NOT DETECTED Final   Candida krusei NOT DETECTED NOT DETECTED Final   Candida parapsilosis NOT DETECTED NOT DETECTED Final   Candida tropicalis NOT DETECTED NOT DETECTED Final  MRSA PCR Screening     Status: None   Collection Time: 06/20/17  2:12 AM  Result Value Ref Range Status   MRSA by PCR NEGATIVE NEGATIVE Final    Comment:        The GeneXpert MRSA  Assay (FDA approved for NASAL specimens only), is one component of a comprehensive MRSA colonization surveillance program. It is not intended to diagnose MRSA infection nor to guide or monitor treatment for MRSA infections.   Culture, blood (Routine X 2) w Reflex to ID Panel     Status: None (Preliminary result)   Collection Time: 06/22/17  1:37 PM  Result Value Ref Range Status   Specimen Description BLOOD LEFT ANTECUBITAL  Final   Special Requests   Final    BOTTLES DRAWN AEROBIC AND ANAEROBIC Blood Culture adequate volume   Culture NO GROWTH 2 DAYS  Final   Report Status PENDING  Incomplete  Culture, blood (Routine X 2) w Reflex to ID Panel     Status: None (Preliminary result)   Collection Time: 06/22/17  1:56 PM  Result Value Ref Range Status   Specimen Description BLOOD  Final   Special Requests NONE  Final   Culture NO GROWTH 2 DAYS  Final   Report Status PENDING  Incomplete     Labs: BNP (last 3 results) No results for input(s): BNP in the last 8760 hours. Basic Metabolic Panel:  Recent Labs Lab 06/19/17 2238 06/20/17 0443 06/21/17 0642 06/22/17 0641 06/23/17 0627  NA 153* 147* 148*  141 138  K 3.6 3.4* 3.8 3.6 3.8  CL 116* 115* 117* 107 105  CO2 24 22 25 25 25   GLUCOSE 113* 147* 88 92 119*  BUN 45* 40* 27* 19 16  CREATININE 3.01* 2.34* 1.36* 1.12* 1.07*  CALCIUM 8.6* 7.4* 7.8* 8.1* 8.3*  MG  --   --   --  1.4*  --    Liver Function Tests:  Recent Labs Lab 06/19/17 2238 06/20/17 0443  AST 19 16  ALT 9* 8*  ALKPHOS 87 58  BILITOT 0.5 0.4  PROT 7.8 5.8*  ALBUMIN 3.4* 2.4*   No results for input(s): LIPASE, AMYLASE in the last 168 hours. No results for input(s): AMMONIA in the last 168 hours. CBC:  Recent Labs Lab 06/19/17 2238 06/20/17 0443 06/21/17 0642 06/22/17 0641 06/23/17 0627  WBC 15.3* 22.2* 15.0* 10.9* 9.0  NEUTROABS 14.5*  --   --   --   --   HGB 14.0 10.6* 11.1* 11.4* 12.5  HCT 44.3 34.6* 36.6 37.6 39.5  MCV 86.7 87.2 87.6 86.4 84.6  PLT 226 212 203 239 278   Cardiac Enzymes:  Recent Labs Lab 06/19/17 2238  TROPONINI 0.06*   BNP: Invalid input(s): POCBNP CBG: No results for input(s): GLUCAP in the last 168 hours. D-Dimer No results for input(s): DDIMER in the last 72 hours. Hgb A1c No results for input(s): HGBA1C in the last 72 hours. Lipid Profile No results for input(s): CHOL, HDL, LDLCALC, TRIG, CHOLHDL, LDLDIRECT in the last 72 hours. Thyroid function studies No results for input(s): TSH, T4TOTAL, T3FREE, THYROIDAB in the last 72 hours.  Invalid input(s): FREET3 Anemia work up No results for input(s): VITAMINB12, FOLATE, FERRITIN, TIBC, IRON, RETICCTPCT in the last 72 hours. Urinalysis    Component Value Date/Time   COLORURINE AMBER (A) 06/19/2017 2208   APPEARANCEUR TURBID (A) 06/19/2017 2208   LABSPEC 1.014 06/19/2017 2208   PHURINE 5.0 06/19/2017 2208   GLUCOSEU NEGATIVE 06/19/2017 2208   HGBUR MODERATE (A) 06/19/2017 2208   BILIRUBINUR NEGATIVE 06/19/2017 2208   KETONESUR NEGATIVE 06/19/2017 2208   PROTEINUR 100 (A) 06/19/2017 2208   UROBILINOGEN 0.2 04/10/2013 1230   NITRITE NEGATIVE 06/19/2017 2208    LEUKOCYTESUR MODERATE (A) 06/19/2017  2208   Sepsis Labs Invalid input(s): PROCALCITONIN,  WBC,  LACTICIDVEN Microbiology Recent Results (from the past 240 hour(s))  Urine culture     Status: Abnormal   Collection Time: 06/19/17 10:08 PM  Result Value Ref Range Status   Specimen Description URINE, CATHETERIZED  Final   Special Requests NONE  Final   Culture >=100,000 COLONIES/mL AEROCOCCUS URINAE (A)  Final   Report Status 06/24/2017 FINAL  Final  Culture, blood (Routine x 2)     Status: Abnormal   Collection Time: 06/19/17 10:20 PM  Result Value Ref Range Status   Specimen Description BLOOD LEFT HAND  Final   Special Requests   Final    Blood Culture results may not be optimal due to an inadequate volume of blood received in culture bottles   Culture  Setup Time   Final    GRAM POSITIVE COCCI Gram Stain Report Called to,Read Back By and Verified With: HAWKINS,M @ 1157 ON 6.25.18 BY BAYSE,L    Culture (A)  Final    AEROCOCCUS URINAE Standardized susceptibility testing for this organism is not available. Performed at [facility], [address] [place], [place] [address]    Report Status 06/23/2017 FINAL  Final  Culture, blood (Routine x 2)     Status: Abnormal (Preliminary result)   Collection Time: 06/19/17 10:50 PM  Result Value Ref Range Status   Specimen Description RIGHT ANTECUBITAL  Final   Special Requests Blood Culture adequate volume  Final   Culture  Setup Time   Final    GRAM POSITIVE COCCI ANAEROBIC BOTTLE Gram Stain Report Called to,Read Back By and Verified With: LIGHTNER,L@659  BY MATTHEWS,B [address] [facility]  CRITICAL RESULT CALLED TO, READ BACK BY AND VERIFIED WITH: S. HALL, PHARMD 1522 06/21/2017 T. TYSOR IN BOTH AEROBIC AND ANAEROBIC BOTTLES CRITICAL VALUE NOTED.  VALUE IS CONSISTENT WITH PREVIOUSLY REPORTED AND CALLED VALUE.    Culture (A)  Final    AEROCOCCUS SPECIES Standardized susceptibility testing for this organism is not  available. Performed at [facility], [address] [place], [place] [address]    Report Status PENDING  Incomplete  Blood Culture ID Panel (Reflexed)     Status: None   Collection Time: 06/19/17 10:50 PM  Result Value Ref Range Status   Enterococcus species NOT DETECTED NOT DETECTED Final   Listeria monocytogenes NOT DETECTED NOT DETECTED Final   Staphylococcus species NOT DETECTED NOT DETECTED Final   Staphylococcus aureus NOT DETECTED NOT DETECTED Final   Streptococcus species NOT DETECTED NOT DETECTED Final   Streptococcus agalactiae NOT DETECTED NOT DETECTED Final   Streptococcus pneumoniae NOT DETECTED NOT DETECTED Final   Streptococcus pyogenes NOT DETECTED NOT DETECTED Final   Acinetobacter baumannii NOT DETECTED NOT DETECTED Final   Enterobacteriaceae species NOT DETECTED NOT DETECTED Final   Enterobacter cloacae complex NOT DETECTED NOT DETECTED Final   Escherichia coli NOT DETECTED NOT DETECTED Final   Klebsiella oxytoca NOT DETECTED NOT DETECTED Final   Klebsiella pneumoniae NOT DETECTED NOT DETECTED Final   Proteus species NOT DETECTED NOT DETECTED Final   Serratia marcescens NOT DETECTED NOT DETECTED Final   Haemophilus influenzae NOT DETECTED NOT DETECTED Final   Neisseria meningitidis NOT DETECTED NOT DETECTED Final   Pseudomonas aeruginosa NOT DETECTED NOT DETECTED Final   Candida albicans NOT DETECTED NOT DETECTED Final   Candida glabrata NOT DETECTED NOT DETECTED Final   Candida krusei NOT DETECTED NOT DETECTED Final   Candida parapsilosis NOT  DETECTED NOT DETECTED Final   Candida tropicalis NOT DETECTED NOT DETECTED Final  MRSA PCR Screening     Status: None   Collection Time: 06/20/17  2:12 AM  Result Value Ref Range Status   MRSA by PCR NEGATIVE NEGATIVE Final    Comment:        The GeneXpert MRSA Assay (FDA approved for NASAL specimens only), is one component of a comprehensive MRSA colonization surveillance program. It is not intended to  diagnose MRSA infection nor to guide or monitor treatment for MRSA infections.   Culture, blood (Routine X 2) w Reflex to ID Panel     Status: None (Preliminary result)   Collection Time: 06/22/17  1:37 PM  Result Value Ref Range Status   Specimen Description BLOOD LEFT ANTECUBITAL  Final   Special Requests   Final    BOTTLES DRAWN AEROBIC AND ANAEROBIC Blood Culture adequate volume   Culture NO GROWTH 2 DAYS  Final   Report Status PENDING  Incomplete  Culture, blood (Routine X 2) w Reflex to ID Panel     Status: None (Preliminary result)   Collection Time: 06/22/17  1:56 PM  Result Value Ref Range Status   Specimen Description BLOOD  Final   Special Requests NONE  Final   Culture NO GROWTH 2 DAYS  Final   Report Status PENDING  Incomplete     Time coordinating discharge: Over 30 minutes  SIGNED:   Erick Blinks, MD  [facility] 06/24/2017, 12:37 PM Pager   If 7PM-7AM, please contact night-coverage [email or web address] Password [email or web address]

## 2017-06-24 NOTE — Progress Notes (Signed)
*  PRELIMINARY RESULTS* Echocardiogram 2D Echocardiogram has been performed.  Jeryl Columbialliott, Ercia Crisafulli 06/24/2017, 2:33 PM

## 2017-06-24 NOTE — Progress Notes (Signed)
Discharge summary given to [facility] associate. Patient escorted out via wheelchair by staff, transported by [facility].

## 2017-06-24 NOTE — NC FL2 (Signed)
[place] MEDICAID FL2 LEVEL OF CARE SCREENING TOOL     IDENTIFICATION  Patient Name: Dawn Lee Birthdate: 06/12/1923 Sex: female Admission Date (Current Location): 06/19/2017  [facility] [place]  [place]   Facility and Address:  [facility],  [address], [place] [id number]      Provider Number: [id number]  Attending Physician Name and Address:  Erick BlinksMemon, Jehanzeb, MD  Relative Name and Phone Number:       Current Level of Care: Hospital Recommended Level of Care: Assisted Living Facility Prior Approval Number:    Date Approved/Denied:   PASRR Number:    Discharge Plan: Other (Comment) Chip Boer([facility])    Current Diagnoses: Patient Active Problem List   Diagnosis Date Noted  . Bacteremia due to Gram-positive bacteria 06/21/2017  . Sepsis due to undetermined organism (HCC) 06/21/2017  . Urinary tract infection without hematuria   . Severe sepsis (HCC) 06/20/2017  . Dementia 06/20/2017  . DNR no code (do not resuscitate) 06/20/2017  . AKI (acute kidney injury) (HCC) 06/20/2017  . Sepsis secondary to UTI (HCC) 06/20/2017  . Hypernatremia 06/20/2017  . Dehydration, moderate 06/20/2017  . Elevated troponin I level 06/20/2017  . Leg mass 09/07/2012  . BURSITIS, LEFT SHOULDER 12/17/2010  . LATERAL EPICONDYLITIS 11/13/2010  . SHOULDER PAIN 07/14/2010  . CLOSED FRACTURE OF LOWER END OF RADIUS WITH ULNA 03/13/2010  . OTHER CLOSED FRACTURES OF DISTAL END OF RADIUS 03/11/2010    Orientation RESPIRATION BLADDER Height & Weight     Self  Normal Incontinent Weight: 149 lb 0.5 oz (67.6 kg) Height:  5\' 4"  (162.6 cm)  BEHAVIORAL SYMPTOMS/MOOD NEUROLOGICAL BOWEL NUTRITION STATUS      Incontinent Diet (Soft. Low Sodium, heart healthy)  AMBULATORY STATUS COMMUNICATION OF NEEDS Skin   Limited Assist Verbally Normal                       Personal Care Assistance Level of Assistance  Bathing, Feeding, Dressing Bathing Assistance: Limited  assistance Feeding assistance: Independent Dressing Assistance: Limited assistance     Functional Limitations Info  Sight, Hearing, Speech Sight Info: Adequate Hearing Info: Adequate Speech Info: Adequate    SPECIAL CARE FACTORS FREQUENCY                       Contractures Contractures Info: Not present    Additional Factors Info  Code Status, Allergies Code Status Info: DNR Allergies Info: Penicillins           Current Medications (06/24/2017):  This is the current hospital active medication list Current Facility-Administered Medications  Medication Dose Route Frequency Provider Last Rate Last Dose  . acetaminophen (TYLENOL) tablet 650 mg  650 mg Oral Q4H PRN Houston SirenLe, Peter, MD      . memantine (NAMENDA XR) 24 hr capsule 14 mg  14 mg Oral QHS Elliot CousinFisher, Denise, MD   14 mg at 06/23/17 2200   And  . donepezil (ARICEPT) tablet 10 mg  10 mg Oral QHS Elliot CousinFisher, Denise, MD   10 mg at 06/23/17 2200  . feeding supplement (ENSURE ENLIVE) (ENSURE ENLIVE) liquid 237 mL  237 mL Oral BID BM Tat, David, MD   237 mL at 06/23/17 1056  . heparin injection 5,000 Units  5,000 Units Subcutaneous Q8H Houston SirenLe, Peter, MD   5,000 Units at 06/24/17 0600  . ketotifen (ZADITOR) 0.025 % ophthalmic solution 1 drop  1 drop Both Eyes BID Houston SirenLe, Peter, MD  1 drop at 06/24/17 0902  . linezolid (ZYVOX) tablet 600 mg  600 mg Oral Q12H Erick Blinks, MD   600 mg at 06/24/17 0901  . pantoprazole (PROTONIX) EC tablet 40 mg  40 mg Oral Daily Elliot Cousin, MD   40 mg at 06/24/17 0901  . sodium chloride flush (NS) 0.9 % injection 3 mL  3 mL Intravenous Q12H Houston Siren, MD   3 mL at 06/24/17 0902     Discharge Medications: TAKE these medications   acetaminophen 500 MG tablet Commonly known as:  TYLENOL Take 500 mg by mouth 2 (two) times daily.   acetaminophen 325 MG tablet Commonly known as:  TYLENOL Take 650 mg by mouth every 6 (six) hours as needed.   Cranberry 450 MG Tabs Take 450 mg by mouth daily.    ketotifen 0.025 % ophthalmic solution Commonly known as:  ZADITOR Apply 1 drop to eye daily as needed (for dry eye).   linezolid 600 MG tablet Commonly known as:  ZYVOX Take 1 tablet (600 mg total) by mouth every 12 (twelve) hours.   NAMZARIC 14-10 MG Cp24 Generic drug:  Memantine HCl-Donepezil HCl Take 1 capsule by mouth at bedtime.   nystatin ointment Commonly known as:  MYCOSTATIN Apply 1 application topically 2 (two) times daily. Vaginally for redness   pantoprazole 20 MG tablet Commonly known as:  PROTONIX Take 20 mg by mouth daily.      Relevant Imaging Results:  Relevant Lab Results:   Additional Information    Mckinnley Cottier, Juleen China, LCSW

## 2017-06-24 NOTE — Clinical Social Work Note (Signed)
LCSW spoke with patient's daughter, Dawn Lee, and discussed PT recommendation.  Dawn Lee was agreeable for patient to [facility]. LCSW advised of patient's discharge on today.    LCS[facility] with [facility]. LCSW advised that patient was discharging. Tammy indicated that they would transport patient back to the facility.      Giulianna Rocha, Juleen ChinaHeather D, LCSW

## 2017-06-25 LAB — CULTURE, BLOOD (ROUTINE X 2): Special Requests: ADEQUATE

## 2017-06-27 LAB — CULTURE, BLOOD (ROUTINE X 2)
CULTURE: NO GROWTH
Culture: NO GROWTH
SPECIAL REQUESTS: ADEQUATE

## 2017-07-01 DIAGNOSIS — K219 Gastro-esophageal reflux disease without esophagitis: Secondary | ICD-10-CM | POA: Diagnosis not present

## 2017-07-01 DIAGNOSIS — F0390 Unspecified dementia without behavioral disturbance: Secondary | ICD-10-CM | POA: Diagnosis not present

## 2017-07-01 DIAGNOSIS — E782 Mixed hyperlipidemia: Secondary | ICD-10-CM | POA: Diagnosis not present

## 2017-07-01 DIAGNOSIS — B9689 Other specified bacterial agents as the cause of diseases classified elsewhere: Secondary | ICD-10-CM | POA: Diagnosis not present

## 2017-07-01 DIAGNOSIS — R7301 Impaired fasting glucose: Secondary | ICD-10-CM | POA: Diagnosis not present

## 2017-07-01 DIAGNOSIS — L988 Other specified disorders of the skin and subcutaneous tissue: Secondary | ICD-10-CM | POA: Diagnosis not present

## 2017-07-01 DIAGNOSIS — N390 Urinary tract infection, site not specified: Secondary | ICD-10-CM | POA: Diagnosis not present

## 2017-07-01 DIAGNOSIS — I10 Essential (primary) hypertension: Secondary | ICD-10-CM | POA: Diagnosis not present

## 2017-07-02 DIAGNOSIS — N390 Urinary tract infection, site not specified: Secondary | ICD-10-CM | POA: Diagnosis not present

## 2017-07-02 DIAGNOSIS — K219 Gastro-esophageal reflux disease without esophagitis: Secondary | ICD-10-CM | POA: Diagnosis not present

## 2017-07-02 DIAGNOSIS — R531 Weakness: Secondary | ICD-10-CM | POA: Diagnosis not present

## 2017-07-02 DIAGNOSIS — M545 Low back pain: Secondary | ICD-10-CM | POA: Diagnosis not present

## 2017-07-02 DIAGNOSIS — A419 Sepsis, unspecified organism: Secondary | ICD-10-CM | POA: Diagnosis not present

## 2017-07-02 DIAGNOSIS — H04129 Dry eye syndrome of unspecified lacrimal gland: Secondary | ICD-10-CM | POA: Diagnosis not present

## 2017-07-02 DIAGNOSIS — F0390 Unspecified dementia without behavioral disturbance: Secondary | ICD-10-CM | POA: Diagnosis not present

## 2017-07-02 DIAGNOSIS — R600 Localized edema: Secondary | ICD-10-CM | POA: Diagnosis not present

## 2017-07-02 DIAGNOSIS — R351 Nocturia: Secondary | ICD-10-CM | POA: Diagnosis not present

## 2017-07-02 NOTE — Clinical Social Work Note (Signed)
LCSW called patient's daughter, Para MarchJeanette. Jeanette answered the phone and stated that she would call LCSW back because she was on another line with another call.       Nicasio Barlowe, Juleen ChinaHeather D, LCSW

## 2017-07-06 DIAGNOSIS — B9689 Other specified bacterial agents as the cause of diseases classified elsewhere: Secondary | ICD-10-CM | POA: Diagnosis not present

## 2017-07-06 DIAGNOSIS — F0390 Unspecified dementia without behavioral disturbance: Secondary | ICD-10-CM | POA: Diagnosis not present

## 2017-07-06 DIAGNOSIS — I10 Essential (primary) hypertension: Secondary | ICD-10-CM | POA: Diagnosis not present

## 2017-07-06 DIAGNOSIS — N390 Urinary tract infection, site not specified: Secondary | ICD-10-CM | POA: Diagnosis not present

## 2017-07-12 DIAGNOSIS — N390 Urinary tract infection, site not specified: Secondary | ICD-10-CM | POA: Diagnosis not present

## 2017-07-12 DIAGNOSIS — A419 Sepsis, unspecified organism: Secondary | ICD-10-CM | POA: Diagnosis not present

## 2017-07-12 DIAGNOSIS — M545 Low back pain: Secondary | ICD-10-CM | POA: Diagnosis not present

## 2017-07-12 DIAGNOSIS — H04129 Dry eye syndrome of unspecified lacrimal gland: Secondary | ICD-10-CM | POA: Diagnosis not present

## 2017-07-14 DIAGNOSIS — B9689 Other specified bacterial agents as the cause of diseases classified elsewhere: Secondary | ICD-10-CM | POA: Diagnosis not present

## 2017-07-14 DIAGNOSIS — F0390 Unspecified dementia without behavioral disturbance: Secondary | ICD-10-CM | POA: Diagnosis not present

## 2017-07-14 DIAGNOSIS — I10 Essential (primary) hypertension: Secondary | ICD-10-CM | POA: Diagnosis not present

## 2017-07-14 DIAGNOSIS — N390 Urinary tract infection, site not specified: Secondary | ICD-10-CM | POA: Diagnosis not present

## 2017-07-15 DIAGNOSIS — F0390 Unspecified dementia without behavioral disturbance: Secondary | ICD-10-CM | POA: Diagnosis not present

## 2017-07-22 DIAGNOSIS — D649 Anemia, unspecified: Secondary | ICD-10-CM | POA: Diagnosis not present

## 2017-07-22 DIAGNOSIS — Z79899 Other long term (current) drug therapy: Secondary | ICD-10-CM | POA: Diagnosis not present

## 2017-07-31 DIAGNOSIS — G9341 Metabolic encephalopathy: Secondary | ICD-10-CM | POA: Diagnosis not present

## 2017-07-31 DIAGNOSIS — R4182 Altered mental status, unspecified: Secondary | ICD-10-CM | POA: Diagnosis not present

## 2017-07-31 DIAGNOSIS — F0280 Dementia in other diseases classified elsewhere without behavioral disturbance: Secondary | ICD-10-CM | POA: Diagnosis not present

## 2017-07-31 DIAGNOSIS — I248 Other forms of acute ischemic heart disease: Secondary | ICD-10-CM | POA: Diagnosis not present

## 2017-07-31 DIAGNOSIS — Z66 Do not resuscitate: Secondary | ICD-10-CM | POA: Diagnosis not present

## 2017-07-31 DIAGNOSIS — E873 Alkalosis: Secondary | ICD-10-CM | POA: Diagnosis not present

## 2017-07-31 DIAGNOSIS — E870 Hyperosmolality and hypernatremia: Secondary | ICD-10-CM | POA: Diagnosis not present

## 2017-07-31 DIAGNOSIS — E440 Moderate protein-calorie malnutrition: Secondary | ICD-10-CM | POA: Diagnosis not present

## 2017-07-31 DIAGNOSIS — T83511A Infection and inflammatory reaction due to indwelling urethral catheter, initial encounter: Secondary | ICD-10-CM | POA: Diagnosis not present

## 2017-07-31 DIAGNOSIS — N390 Urinary tract infection, site not specified: Secondary | ICD-10-CM | POA: Diagnosis not present

## 2017-07-31 DIAGNOSIS — L89152 Pressure ulcer of sacral region, stage 2: Secondary | ICD-10-CM | POA: Diagnosis not present

## 2017-07-31 DIAGNOSIS — E860 Dehydration: Secondary | ICD-10-CM | POA: Diagnosis not present

## 2017-07-31 DIAGNOSIS — R6520 Severe sepsis without septic shock: Secondary | ICD-10-CM | POA: Diagnosis not present

## 2017-07-31 DIAGNOSIS — G309 Alzheimer's disease, unspecified: Secondary | ICD-10-CM | POA: Diagnosis not present

## 2017-07-31 DIAGNOSIS — G301 Alzheimer's disease with late onset: Secondary | ICD-10-CM | POA: Diagnosis not present

## 2017-07-31 DIAGNOSIS — E878 Other disorders of electrolyte and fluid balance, not elsewhere classified: Secondary | ICD-10-CM | POA: Diagnosis not present

## 2017-07-31 DIAGNOSIS — Z681 Body mass index (BMI) 19 or less, adult: Secondary | ICD-10-CM | POA: Diagnosis not present

## 2017-07-31 DIAGNOSIS — E875 Hyperkalemia: Secondary | ICD-10-CM | POA: Diagnosis not present

## 2017-07-31 DIAGNOSIS — N179 Acute kidney failure, unspecified: Secondary | ICD-10-CM | POA: Diagnosis not present

## 2017-07-31 DIAGNOSIS — E872 Acidosis: Secondary | ICD-10-CM | POA: Diagnosis not present

## 2017-07-31 DIAGNOSIS — R739 Hyperglycemia, unspecified: Secondary | ICD-10-CM | POA: Diagnosis not present

## 2017-07-31 DIAGNOSIS — A419 Sepsis, unspecified organism: Secondary | ICD-10-CM | POA: Diagnosis not present

## 2017-08-28 DEATH — deceased
